# Patient Record
Sex: Female | Born: 1995 | Race: Black or African American | Hispanic: No | Marital: Single | Smoking: Never smoker
Health system: Southern US, Community
[De-identification: ages and names within clinical notes are randomized; demographics above are authoritative.]

## PROBLEM LIST (undated history)

## (undated) ENCOUNTER — Inpatient Hospital Stay (HOSPITAL_COMMUNITY): Payer: Self-pay

## (undated) DIAGNOSIS — R519 Headache, unspecified: Secondary | ICD-10-CM

## (undated) DIAGNOSIS — J45909 Unspecified asthma, uncomplicated: Secondary | ICD-10-CM

## (undated) DIAGNOSIS — R51 Headache: Secondary | ICD-10-CM

## (undated) DIAGNOSIS — O039 Complete or unspecified spontaneous abortion without complication: Secondary | ICD-10-CM

## (undated) DIAGNOSIS — D649 Anemia, unspecified: Secondary | ICD-10-CM

## (undated) HISTORY — PX: DILATION AND CURETTAGE OF UTERUS: SHX78

## (undated) HISTORY — PX: OTHER SURGICAL HISTORY: SHX169

---
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
## 2015-03-06 ENCOUNTER — Emergency Department (HOSPITAL_COMMUNITY)
Admission: EM | Admit: 2015-03-06 | Discharge: 2015-03-06 | Disposition: A | Discharge status: Home / Self Care | Payer: Medicaid Other | Attending: Emergency Medicine | Admitting: Emergency Medicine

## 2015-03-06 ENCOUNTER — Encounter (HOSPITAL_COMMUNITY): Payer: Self-pay | Admitting: Vascular Surgery

## 2015-03-06 DIAGNOSIS — J45909 Unspecified asthma, uncomplicated: Secondary | ICD-10-CM | POA: Diagnosis not present

## 2015-03-06 DIAGNOSIS — Z3202 Encounter for pregnancy test, result negative: Secondary | ICD-10-CM | POA: Insufficient documentation

## 2015-03-06 DIAGNOSIS — N760 Acute vaginitis: Secondary | ICD-10-CM | POA: Diagnosis not present

## 2015-03-06 DIAGNOSIS — K2970 Gastritis, unspecified, without bleeding: Secondary | ICD-10-CM

## 2015-03-06 DIAGNOSIS — R1013 Epigastric pain: Secondary | ICD-10-CM

## 2015-03-06 DIAGNOSIS — B9689 Other specified bacterial agents as the cause of diseases classified elsewhere: Secondary | ICD-10-CM

## 2015-03-06 HISTORY — DX: Complete or unspecified spontaneous abortion without complication: O03.9

## 2015-03-06 HISTORY — DX: Unspecified asthma, uncomplicated: J45.909

## 2015-03-06 LAB — COMPREHENSIVE METABOLIC PANEL
ALBUMIN: 3.9 g/dL (ref 3.5–5.0)
ALT: 10 U/L — ABNORMAL LOW (ref 14–54)
ANION GAP: 6 (ref 5–15)
AST: 16 U/L (ref 15–41)
Alkaline Phosphatase: 56 U/L (ref 38–126)
BUN: 6 mg/dL (ref 6–20)
CO2: 25 mmol/L (ref 22–32)
Calcium: 9.5 mg/dL (ref 8.9–10.3)
Chloride: 109 mmol/L (ref 101–111)
Creatinine, Ser: 0.92 mg/dL (ref 0.44–1.00)
GFR calc Af Amer: >60 mL/min (ref >60)
GFR calc non Af Amer: >60 mL/min (ref >60)
GLUCOSE: 97 mg/dL (ref 65–99)
POTASSIUM: 3.6 mmol/L (ref 3.5–5.1)
SODIUM: 140 mmol/L (ref 135–145)
Total Bilirubin: 0.7 mg/dL (ref 0.3–1.2)
Total Protein: 6.9 g/dL (ref 6.5–8.1)

## 2015-03-06 LAB — CBC
HEMATOCRIT: 38 % (ref 36.0–46.0)
HEMOGLOBIN: 12.9 g/dL (ref 12.0–15.0)
MCH: 30.1 pg (ref 26.0–34.0)
MCHC: 33.9 g/dL (ref 30.0–36.0)
MCV: 88.8 fL (ref 78.0–100.0)
Platelets: 232 10*3/uL (ref 150–400)
RBC: 4.28 MIL/uL (ref 3.87–5.11)
RDW: 12.9 % (ref 11.5–15.5)
WBC: 5.6 10*3/uL (ref 4.0–10.5)

## 2015-03-06 LAB — URINALYSIS, ROUTINE W REFLEX MICROSCOPIC
Bilirubin Urine: NEGATIVE
GLUCOSE, UA: NEGATIVE mg/dL
HGB URINE DIPSTICK: NEGATIVE
Ketones, ur: NEGATIVE mg/dL
Leukocytes, UA: NEGATIVE
Nitrite: NEGATIVE
PH: 7 (ref 5.0–8.0)
Protein, ur: NEGATIVE mg/dL
SPECIFIC GRAVITY, URINE: 1.023 (ref 1.005–1.030)
Urobilinogen, UA: 0.2 mg/dL (ref 0.0–1.0)

## 2015-03-06 LAB — POC URINE PREG, ED: PREG TEST UR: NEGATIVE

## 2015-03-06 LAB — WET PREP, GENITAL
TRICH WET PREP: NONE SEEN
WBC WET PREP: NONE SEEN
YEAST WET PREP: NONE SEEN

## 2015-03-06 LAB — LIPASE, BLOOD: Lipase: 21 U/L — ABNORMAL LOW (ref 22–51)

## 2015-03-06 MED ORDER — GI COCKTAIL ~~LOC~~
30.0000 mL | Freq: Once | ORAL | Status: AC
  Administered 2015-03-06: 30 mL via ORAL
  Filled 2015-03-06: qty 30

## 2015-03-06 MED ORDER — METRONIDAZOLE 500 MG PO TABS: 500.0000 mg | ORAL_TABLET | Freq: Two times a day (BID) | ORAL | Status: DC

## 2015-03-06 NOTE — Discharge Instructions (Signed)
Bacterial Vaginosis Bacterial vaginosis is a vaginal infection that occurs when the normal balance of bacteria in the vagina is disrupted. It results from an overgrowth of certain bacteria. This is the most common vaginal infection in women of childbearing age. Treatment is important to prevent complications, especially in pregnant women, as it can cause a premature delivery. CAUSES  Bacterial vaginosis is caused by an increase in harmful bacteria that are normally present in smaller amounts in the vagina. Several different kinds of bacteria can cause bacterial vaginosis. However, the reason that the condition develops is not fully understood. RISK FACTORS Certain activities or behaviors can put you at an increased risk of developing bacterial vaginosis, including:  Having a new sex partner or multiple sex partners.  Douching.  Using an intrauterine device (IUD) for contraception. Women do not get bacterial vaginosis from toilet seats, bedding, swimming pools, or contact with objects around them. SIGNS AND SYMPTOMS  Some women with bacterial vaginosis have no signs or symptoms. Common symptoms include:  Grey vaginal discharge.  A fishlike odor with discharge, especially after sexual intercourse.  Itching or burning of the vagina and vulva.  Burning or pain with urination. DIAGNOSIS  Your health care provider will take a medical history and examine the vagina for signs of bacterial vaginosis. A sample of vaginal fluid may be taken. Your health care provider will look at this sample under a microscope to check for bacteria and abnormal cells. A vaginal pH test may also be done.  TREATMENT  Bacterial vaginosis may be treated with antibiotic medicines. These may be given in the form of a pill or a vaginal cream. A second round of antibiotics may be prescribed if the condition comes back after treatment.  HOME CARE INSTRUCTIONS   Only take over-the-counter or prescription medicines as  directed by your health care provider.  If antibiotic medicine was prescribed, take it as directed. Make sure you finish it even if you start to feel better.  Do not have sex until treatment is completed.  Tell all sexual partners that you have a vaginal infection. They should see their health care provider and be treated if they have problems, such as a mild rash or itching.  Practice safe sex by using condoms and only having one sex partner. SEEK MEDICAL CARE IF:   Your symptoms are not improving after 3 days of treatment.  You have increased discharge or pain.  You have a fever. MAKE SURE YOU:   Understand these instructions.  Will watch your condition.  Will get help right away if you are not doing well or get worse. FOR MORE INFORMATION  Centers for Disease Control and Prevention, Division of STD Prevention: SolutionApps.co.za American Sexual Health Association (ASHA): www.ashastd.org  Document Released: 07/01/2005 Document Revised: 04/21/2013 Document Reviewed: 02/10/2013 [facility] Patient Information 2015 [place], [place]. This information is not intended to replace advice given to you by your health care provider. Make sure you discuss any questions you have with your health care provider.  Gastritis, Adult Gastritis is soreness and puffiness (inflammation) of the lining of the stomach. If you do not get help, gastritis can cause bleeding and sores (ulcers) in the stomach. HOME CARE   Only take medicine as told by your doctor.  If you were given antibiotic medicines, take them as told. Finish the medicines even if you start to feel better.  Drink enough fluids to keep your pee (urine) clear or pale yellow.  Avoid foods and drinks that make your  problems worse. Foods you may want to avoid include:  Caffeine or alcohol.  Chocolate.  Mint.  Garlic and onions.  Spicy foods.  Citrus fruits, including oranges, lemons, or limes.  Food containing tomatoes, including  sauce, chili, salsa, and pizza.  Fried and fatty foods.  Eat small meals throughout the day instead of large meals. GET HELP RIGHT AWAY IF:   You have black or dark red poop (stools).  You throw up (vomit) blood. It may look like coffee grounds.  You cannot keep fluids down.  Your belly (abdominal) pain gets worse.  You have a fever.  You do not feel better after 1 week.  You have any other questions or concerns. MAKE SURE YOU:   Understand these instructions.  Will watch your condition.  Will get help right away if you are not doing well or get worse. Document Released: 12/18/2007 Document Revised: 09/23/2011 Document Reviewed: 08/14/2011 [facility] Patient Information 2015 [place], [place]. This information is not intended to replace advice given to you by your health care provider. Make sure you discuss any questions you have with your health care provider.

## 2015-03-06 NOTE — ED Provider Notes (Signed)
Patient presented to the ER with abdominal pain. Patient reports that symptoms began several days ago. She reports the pain comes and goes, has not identified any causative factors. She has had mild nausea but no vomiting or diarrhea. No rectal bleeding or melanotic. Patient denies any pain in the lower abdomen or pelvis, no symptoms below the umbilicus other than the fact that she is experiencing vaginal bleeding currently which is early for her normal menstrual period.  Face to face Exam: HEENT - PERRLA Lungs - CTAB Heart - RRR, no M/R/G Abd - S/ND, slight epigastric tenderness, no right upper quadrant tenderness. No guarding or rebound Neuro - alert, oriented x3  Plan: Lab work unremarkable. Epigastric discomfort difficult to connect to menstrual bleeding. Likely 2 separate entities, will need to follow-up with OB/GYN for vaginal bleeding, treat for possible gastritis symptomatically.  Gilda Crease, MD 03/06/15 [phone]

## 2015-03-06 NOTE — ED Provider Notes (Signed)
History   Chief Complaint  Patient presents with  . Abdominal Pain  . Vaginal Bleeding    HPI Previous healthy 19 year old female with no pertinent past medical history presents ED for evaluation of abdominal pain and vaginal bleeding. Patient reports yesterday afternoon she had gradual onset epigastric pain with mild nausea but denies any vomiting, diarrhea. She also felt chills but denies any fevers during this time. She says applying pressure to her epigastric region improves her pain. They does not alter her symptoms. Pain does not radiate. Currently rated as mild to moderate. Progression is been unchanged. This is a new problem which she has not had before. She has not tried any treatments at home. Patient reports having some mild shortness of breath when the pain gets worse but denies any shortness of breath, chest pain otherwise. No recent illness and denies any cough, URI symptoms, leg pain, leg swelling. She does report having some white discharge which is new. Denies history of STDs. Does have one new sexual partner who she has had unprotected sex with. Patient is not taking NSAIDs. Not on any control pills. No history of prior abdominal surgeries. No other complaints this time.  Past medical/surgical history, social history, medications, allergies and FH have been reviewed with patient and/or in documentation.  Past Medical History  Diagnosis Date  . Asthma   . Miscarriage    Past Surgical History  Procedure Laterality Date  . Miscarriage    . Dilation and curettage of uterus     No family history on file. Social History  Substance Use Topics  . Smoking status: Never Smoker   . Smokeless tobacco: Never Used  . Alcohol Use: No     Review of Systems Constitutional: Negative for fever, chills and fatigue.  HENT: Negative for congestion, rhinorrhea and sore throat.   Eyes: Negative for visual disturbance.  Respiratory: Negative for cough, shortness of breath and wheezing.    Cardiovascular: Negative for chest pain.  Gastrointestinal: Pos abd pain, nausea, neg vomiting, and diarrhea.  Genitourinary: Negative for flank pain, dysuria, frequency. + VB, vag d/c Musculoskeletal: Negative for back pain, neck pain and neck stiffness, leg pain/swelling.  Skin: Negative for rash.  Neurological: Negative for dizziness and headaches.  All other systems reviewed and are negative.   Physical Exam  Physical Exam  Genitourinary: There is no rash, tenderness, lesion or injury on the right labia. There is no tenderness, lesion or injury on the left labia. Uterus is not enlarged and not tender. Cervix exhibits no motion tenderness, no discharge and no friability. Right adnexum displays no mass, no tenderness and no fullness. Left adnexum displays no mass, no tenderness and no fullness. There is bleeding in the vagina. No erythema or tenderness in the vagina. No foreign body around the vagina. No signs of injury around the vagina. No vaginal discharge found.   ED Triage Vitals  Enc Vitals Group     BP 03/06/15 1127 126/76 mmHg     Pulse Rate 03/06/15 1127 74     Resp 03/06/15 1127 16     Temp 03/06/15 1127 98 F (36.7 C)     Temp Source 03/06/15 1127 Oral     SpO2 03/06/15 1127 100 %     Weight --      Height --      Head Cir --      Peak Flow --      Pain Score 03/06/15 1127 8     Pain Loc --  Pain Edu? --      Excl. in GC? --    Constitutional: Patient is well-appearing, well-nourished and in no acute distress. Head: Normocephalic and atraumatic.  Eyes: Extraocular motion intact, no scleral icterus Neck: Supple without meningismus, mass, or overt JVD Respiratory: Effort normal and breath sounds normal. No respiratory distress. CV: Heart regular rate and rhythm, no obvious murmurs.  Pulses +2 and symmetric Abdomen: Soft, non-distended, with noTTP in all region(s). No rebound/guarding. No TTP over McBurney's, Neg Murphy/Rovsing signs. No mass.  MSK: Extremities  are atraumatic without deformity, ROM intact Skin: Warm, dry, intact Neuro: Alert and oriented, no motor deficit noted Psychiatric: Mood and affect are normal.   ED Course  Procedures   Labs Reviewed  LIPASE, BLOOD - Abnormal; Notable for the following:    Lipase 21 (*)    All other components within normal limits  COMPREHENSIVE METABOLIC PANEL - Abnormal; Notable for the following:    ALT 10 (*)    All other components within normal limits  URINALYSIS, ROUTINE W REFLEX MICROSCOPIC (NOT AT [facility]) - Abnormal; Notable for the following:    APPearance HAZY (*)    All other components within normal limits  CBC  POC URINE PREG, ED  WET PREP  (BD AFFIRM) ([place])  GC/CHLAMYDIA PROBE AMP ([place]) NOT AT [facility]   I personally reviewed and interpreted all labs.  No orders to display   I personally viewed above image(s) which were used in my medical decision making. Formal interpretations by Radiology.  MDM: Traci Spears is a 19 y.o. female p/w abd pain. H&P as above.  Clinical picture consistent with likely GI source. Does not suggest acute obstruction, perforation, ischemia, acute pancreatitis, cholecystitis, appendicitis, AAA, UTI, pyelonephritis, nephrolithiasis, diverticulosis/itis, hernia, PID, Intrauterine Pregnancy, Ectopic Pregnancy, Ovarian Torsion, Tubal Ovarian Abscess, STD, or other acute abdominal emergency which might require inpatient or emergent evaluation or treatment as this would be inconsistent with history and physical.   Additionally, patient is perc negative and I have very low suspicion for PE as patient's clinical picture does not correlate with this. Pelvic exam was performed and is benign.  Symptoms reported to be improving. Labs reviewed, has lab findings consistent with bacterial vaginosis and otherwise found to be WNL. Feel patient's symptoms are likely secondary to gastritis and advised Maalox. Discussed with patient. Will continue symptomatic  management. Advised close PCP follow-up if not improved in 2 days.  Clinical Impression: 1. Epigastric pain   2. Gastritis   3. Bacterial vaginosis     Disposition: Discharge  Condition: Good  I have discussed the results, Dx and Tx plan with the pt(& family if present). He/she/they expressed understanding and agree(s) with the plan. Discharge instructions discussed at great length. Strict return precautions discussed and pt &/or family have verbalized understanding of the instructions. No further questions at time of discharge.    Discharge Medication List as of 03/06/2015  5:52 PM    START taking these medications   Details  metroNIDAZOLE (FLAGYL) 500 MG tablet Take 1 tablet (500 mg total) by mouth 2 (two) times daily., Starting 03/06/2015, Until Discontinued, Print        Follow Up: [facility]     [address] [place] [address] [phone]  As needed, To establish primary care, call above  [facility] EMERGENCY DEPARTMENT [address] [id number] mc [place] [address] [phone]  If symptoms worsen   Pt seen in  conjunction with Dr. Orpah Greek, DO [facility] Emergency Medicine Resident - PGY-3     Ames Dura, MD 03/07/15 [id number]  Gilda Crease, MD 03/07/15 [phone]

## 2015-03-06 NOTE — ED Notes (Signed)
Pt reports to the ED for eval of mid abd pain and vaginal bleeding. Pt reports that this am she passed a small blood clot. She reports after passing the clot she stopped bleeding. Pt reports recent unprotected sex several days ago. Pt reports nausea but denies any V/D or urinary symptoms. Reports some chills but unsure if she is running a fever. Pt A&Ox4, resp e/u, and skin warm and dry.

## 2015-03-07 LAB — GC/CHLAMYDIA PROBE AMP (~~LOC~~) NOT AT ARMC
Chlamydia: NEGATIVE
Neisseria Gonorrhea: NEGATIVE

## 2015-05-18 ENCOUNTER — Emergency Department (HOSPITAL_COMMUNITY): Payer: Medicaid Other

## 2015-05-18 ENCOUNTER — Encounter (HOSPITAL_COMMUNITY): Payer: Self-pay

## 2015-05-18 ENCOUNTER — Emergency Department (HOSPITAL_COMMUNITY)
Admission: EM | Admit: 2015-05-18 | Discharge: 2015-05-18 | Disposition: A | Discharge status: Home / Self Care | Payer: Self-pay | Attending: Emergency Medicine | Admitting: Emergency Medicine

## 2015-05-18 DIAGNOSIS — J45909 Unspecified asthma, uncomplicated: Secondary | ICD-10-CM | POA: Insufficient documentation

## 2015-05-18 DIAGNOSIS — R1030 Lower abdominal pain, unspecified: Secondary | ICD-10-CM | POA: Insufficient documentation

## 2015-05-18 DIAGNOSIS — N8300 Follicular cyst of ovary, unspecified side: Secondary | ICD-10-CM | POA: Insufficient documentation

## 2015-05-18 DIAGNOSIS — N83209 Unspecified ovarian cyst, unspecified side: Secondary | ICD-10-CM

## 2015-05-18 DIAGNOSIS — R102 Pelvic and perineal pain: Secondary | ICD-10-CM | POA: Insufficient documentation

## 2015-05-18 DIAGNOSIS — R1032 Left lower quadrant pain: Secondary | ICD-10-CM | POA: Insufficient documentation

## 2015-05-18 DIAGNOSIS — R1033 Periumbilical pain: Secondary | ICD-10-CM | POA: Insufficient documentation

## 2015-05-18 DIAGNOSIS — R52 Pain, unspecified: Secondary | ICD-10-CM

## 2015-05-18 DIAGNOSIS — R1031 Right lower quadrant pain: Secondary | ICD-10-CM | POA: Insufficient documentation

## 2015-05-18 LAB — COMPREHENSIVE METABOLIC PANEL
ALBUMIN: 3.9 g/dL (ref 3.5–5.0)
ALT: 8 U/L — AB (ref 14–54)
ANION GAP: 8 (ref 5–15)
AST: 15 U/L (ref 15–41)
Alkaline Phosphatase: 48 U/L (ref 38–126)
BUN: 16 mg/dL (ref 6–20)
CHLORIDE: 107 mmol/L (ref 101–111)
CO2: 22 mmol/L (ref 22–32)
CREATININE: 0.93 mg/dL (ref 0.44–1.00)
Calcium: 9.2 mg/dL (ref 8.9–10.3)
GFR calc non Af Amer: >60 mL/min (ref >60)
GLUCOSE: 84 mg/dL (ref 65–99)
Potassium: 4 mmol/L (ref 3.5–5.1)
SODIUM: 137 mmol/L (ref 135–145)
Total Bilirubin: 0.4 mg/dL (ref 0.3–1.2)
Total Protein: 6.4 g/dL — ABNORMAL LOW (ref 6.5–8.1)

## 2015-05-18 LAB — CBC WITH DIFFERENTIAL/PLATELET
BASOS PCT: 0 %
Basophils Absolute: 0 10*3/uL (ref 0.0–0.1)
EOS ABS: 0.2 10*3/uL (ref 0.0–0.7)
EOS PCT: 2 %
HCT: 35.5 % — ABNORMAL LOW (ref 36.0–46.0)
HEMOGLOBIN: 12.3 g/dL (ref 12.0–15.0)
LYMPHS ABS: 2.2 10*3/uL (ref 0.7–4.0)
Lymphocytes Relative: 27 %
MCH: 30.4 pg (ref 26.0–34.0)
MCHC: 34.6 g/dL (ref 30.0–36.0)
MCV: 87.7 fL (ref 78.0–100.0)
Monocytes Absolute: 0.7 10*3/uL (ref 0.1–1.0)
Monocytes Relative: 9 %
NEUTROS PCT: 62 %
Neutro Abs: 5.1 10*3/uL (ref 1.7–7.7)
PLATELETS: 245 10*3/uL (ref 150–400)
RBC: 4.05 MIL/uL (ref 3.87–5.11)
RDW: 12.8 % (ref 11.5–15.5)
WBC: 8.2 10*3/uL (ref 4.0–10.5)

## 2015-05-18 LAB — WET PREP, GENITAL
TRICH WET PREP: NONE SEEN
YEAST WET PREP: NONE SEEN

## 2015-05-18 LAB — URINALYSIS, ROUTINE W REFLEX MICROSCOPIC
Bilirubin Urine: NEGATIVE
Glucose, UA: NEGATIVE mg/dL
Hgb urine dipstick: NEGATIVE
Ketones, ur: NEGATIVE mg/dL
LEUKOCYTES UA: NEGATIVE
NITRITE: NEGATIVE
PH: 7.5 (ref 5.0–8.0)
Protein, ur: NEGATIVE mg/dL
SPECIFIC GRAVITY, URINE: 1.016 (ref 1.005–1.030)
UROBILINOGEN UA: 1 mg/dL (ref 0.0–1.0)

## 2015-05-18 LAB — HIV ANTIBODY (ROUTINE TESTING W REFLEX): HIV Screen 4th Generation wRfx: NONREACTIVE

## 2015-05-18 LAB — POC URINE PREG, ED: Preg Test, Ur: NEGATIVE

## 2015-05-18 LAB — LIPASE, BLOOD: Lipase: 37 U/L (ref 11–51)

## 2015-05-18 MED ORDER — NAPROXEN 500 MG PO TABS: 500.0000 mg | ORAL_TABLET | Freq: Two times a day (BID) | ORAL | Status: DC

## 2015-05-18 MED ORDER — SODIUM CHLORIDE 0.9 % IV BOLUS (SEPSIS)
1000.0000 mL | Freq: Once | INTRAVENOUS | Status: AC
  Administered 2015-05-18: 1000 mL via INTRAVENOUS

## 2015-05-18 MED ORDER — IBUPROFEN 800 MG PO TABS
800.0000 mg | ORAL_TABLET | Freq: Once | ORAL | Status: AC
  Administered 2015-05-18: 800 mg via ORAL
  Filled 2015-05-18: qty 1

## 2015-05-18 NOTE — Discharge Instructions (Signed)
Ovarian Cyst  An ovarian cyst is a sac filled with fluid or blood. This sac is attached to the ovary. Some cysts go away on their own. Other cysts need treatment.   HOME CARE   · Only take medicine as told by your doctor.  · Follow up with your doctor as told.  · Get regular pelvic exams and Pap tests.  GET HELP IF:  · Your periods are late, not regular, or painful.  · You stop having periods.  · Your belly (abdominal) or pelvic pain does not go away.  · Your belly becomes large or puffy (swollen).  · You have a hard time peeing (totally emptying your bladder).  · You have pressure on your bladder.  · You have pain during sex.  · You feel fullness, pressure, or discomfort in your belly.  · You lose weight for no reason.  · You feel sick most of the time.  · You have a hard time pooping (constipation).  · You do not feel like eating.  · You develop pimples (acne).  · You have an increase in hair on your body and face.  · You are gaining weight for no reason.  · You think you are pregnant.  GET HELP RIGHT AWAY IF:   · Your belly pain gets worse.  · You feel sick to your stomach (nauseous), and you throw up (vomit).  · You have a fever that comes on fast.  · You have belly pain while pooping (bowel movement).  · Your periods are heavier than usual.  MAKE SURE YOU:   · Understand these instructions.  · Will watch your condition.  · Will get help right away if you are not doing well or get worse.     This information is not intended to replace advice given to you by your health care provider. Make sure you discuss any questions you have with your health care provider.     Document Released: 12/18/2007 Document Revised: 04/21/2013 Document Reviewed: 03/08/2013  Elsevier Interactive Patient Education ©2016 [employer]

## 2015-05-18 NOTE — ED Provider Notes (Signed)
CSN: [id number]     Arrival date & time 05/18/15  [id number] History   First MD Initiated Contact with Patient 05/18/15 0631     Chief Complaint  Patient presents with  . Abdominal Pain  . Vaginal Discharge     (Consider location/radiation/quality/duration/timing/severity/associated sxs/prior Treatment) HPI   Gilman Buttneriquea Plasencia is a 19 y.o. female with PMH significant for asthma and miscarriage who presents with lower abdominal pain that began around 2 AM this morning.  She describes it as sharp, non-radiating pulsing/throbbing, and constant.  Relieved with naprosyn.  Made worse sitting.  Endorses vaginal discharge (yellow) and pelvic pain.  Denies SOB, CP, N/V, urinary symptoms, or vaginal bleeding.  LMP May 04, 2015.  She is sexually active.   Past Medical History  Diagnosis Date  . Asthma   . Miscarriage    Past Surgical History  Procedure Laterality Date  . Miscarriage    . Dilation and curettage of uterus     History reviewed. No pertinent family history. Social History  Substance Use Topics  . Smoking status: Never Smoker   . Smokeless tobacco: Never Used  . Alcohol Use: No   OB History    No data available     Review of Systems All other systems negative unless otherwise stated in HPI   Allergies  Zofran  Home Medications   Prior to Admission medications   Medication Sig Start Date End Date Taking? Authorizing Provider  metroNIDAZOLE (FLAGYL) 500 MG tablet Take 1 tablet (500 mg total) by mouth 2 (two) times daily. Patient not taking: Reported on 05/18/2015 03/06/15   Ames DuraStephen Balleh, MD  naproxen (NAPROSYN) 500 MG tablet Take 1 tablet (500 mg total) by mouth 2 (two) times daily. 05/18/15   Gabe Glace, PA-C   BP 125/75 mmHg  Pulse 70  Temp(Src) 98.1 F (36.7 C) (Oral)  Resp 16  Ht 5' 1.5" (1.562 m)  Wt 155 lb (70.308 kg)  BMI 28.82 kg/m2  SpO2 100% Physical Exam  Constitutional: She is oriented to person, place, and time. She appears well-developed and  well-nourished.  HENT:  Head: Normocephalic and atraumatic.  Mouth/Throat: Oropharynx is clear and moist.  Eyes: Conjunctivae are normal. Pupils are equal, round, and reactive to light.  Neck: Normal range of motion. Neck supple.  Cardiovascular: Normal rate, regular rhythm and normal heart sounds.   No murmur heard. Pulmonary/Chest: Effort normal and breath sounds normal. No accessory muscle usage or stridor. No respiratory distress. She has no wheezes. She has no rhonchi. She has no rales.  Abdominal: Soft. Bowel sounds are normal. She exhibits no distension. There is tenderness in the right lower quadrant, periumbilical area, suprapubic area and left lower quadrant. There is no rigidity, no rebound and no guarding.  Genitourinary: Uterus normal. There is no tenderness or lesion on the right labia. There is no tenderness or lesion on the left labia. Cervix exhibits discharge (mild). Cervix exhibits no motion tenderness. Right adnexum displays no tenderness. Left adnexum displays no tenderness. No tenderness or bleeding in the vagina. No vaginal discharge found.  Musculoskeletal: Normal range of motion.  Lymphadenopathy:    She has no cervical adenopathy.  Neurological: She is alert and oriented to person, place, and time.  Speech clear without dysarthria.  Skin: Skin is warm and dry.  Psychiatric: She has a normal mood and affect. Her behavior is normal.    ED Course  Procedures (including critical care time) Labs Review Labs Reviewed  WET PREP, GENITAL - Abnormal; Notable  for the following:    Clue Cells Wet Prep HPF POC FEW (*)    WBC, Wet Prep HPF POC FEW (*)    All other components within normal limits  CBC WITH DIFFERENTIAL/PLATELET - Abnormal; Notable for the following:    HCT 35.5 (*)    All other components within normal limits  COMPREHENSIVE METABOLIC PANEL - Abnormal; Notable for the following:    Total Protein 6.4 (*)    ALT 8 (*)    All other components within normal  limits  LIPASE, BLOOD  URINALYSIS, ROUTINE W REFLEX MICROSCOPIC (NOT AT [facility])  HIV ANTIBODY (ROUTINE TESTING)  POC URINE PREG, ED  GC/CHLAMYDIA PROBE AMP ([facility]) NOT AT [facility]    Imaging Review US Transvaginal Non-ob  05/18/2015  CLINICAL DATA:  Left lower quadrant and mid pelvic pain since 2 a.m. today EXAM: TRANSABDOMINAL AND TRANSVAGINAL ULTRASOUND OF PELVIS TECHNIQUE: Both transabdominal and transvaginal ultrasound examinations of the pelvis were performed. Transabdominal technique was performed for global imaging of the pelvis including uterus, ovaries, adnexal regions, and pelvic cul-de-sac. It was necessary to proceed with endovaginal exam following the transabdominal exam to visualize the endometrium and ovaries. COMPARISON:  None in PACs FINDINGS: Uterus Measurements: 7.0 x 3.7 x 4.2 cm. No fibroids or other mass visualized. Endometrium Thickness: 6.6 mm.  No focal abnormality visualized. Right ovary Measurements: 3.5 x 2.3 x 2.6 cm. Normal appearance/no adnexal mass. Left ovary Measurements: 4.3 x 2.9 x 3.5 cm. There is a 3.2 x 2.0 x 2.3 cm focus adjacent to the left ovary which exhibits mixed decreased echogenicity. The structure is not hypervascular. There are developing follicles in the surrounding ovarian tissue. Other findings There is a small amount of free pelvic fluid. IMPRESSION: 1. Complex hypoechoic structure in the left ovary likely reflects a hemorrhagic cyst measuring up to 3.2 cm. There is a small amount of free pelvic fluid. Correlation with patient's beta HCG and white blood cell count is recommended. If the patient's symptoms resolve, no further imaging is indicated. If patient's symptoms persist, follow-up ultrasound in 6-12 weeks is recommended. 2. The right ovary and the uterus are unremarkable. Electronically Signed   By: David  [place] M.D.   On: 05/18/2015 09:54   US Pelvis Complete  05/18/2015  CLINICAL DATA:  Left lower quadrant and mid pelvic pain since 2 a.m.  today EXAM: TRANSABDOMINAL AND TRANSVAGINAL ULTRASOUND OF PELVIS TECHNIQUE: Both transabdominal and transvaginal ultrasound examinations of the pelvis were performed. Transabdominal technique was performed for global imaging of the pelvis including uterus, ovaries, adnexal regions, and pelvic cul-de-sac. It was necessary to proceed with endovaginal exam following the transabdominal exam to visualize the endometrium and ovaries. COMPARISON:  None in PACs FINDINGS: Uterus Measurements: 7.0 x 3.7 x 4.2 cm. No fibroids or other mass visualized. Endometrium Thickness: 6.6 mm.  No focal abnormality visualized. Right ovary Measurements: 3.5 x 2.3 x 2.6 cm. Normal appearance/no adnexal mass. Left ovary Measurements: 4.3 x 2.9 x 3.5 cm. There is a 3.2 x 2.0 x 2.3 cm focus adjacent to the left ovary which exhibits mixed decreased echogenicity. The structure is not hypervascular. There are developing follicles in the surrounding ovarian tissue. Other findings There is a small amount of free pelvic fluid. IMPRESSION: 1. Complex hypoechoic structure in the left ovary likely reflects a hemorrhagic cyst measuring up to 3.2 cm. There is a small amount of free pelvic fluid. Correlation with patient's beta HCG and white blood cell count is recommended. If the patient's symptoms  resolve, no further imaging is indicated. If patient's symptoms persist, follow-up ultrasound in 6-12 weeks is recommended. 2. The right ovary and the uterus are unremarkable. Electronically Signed   By: David  [place] M.D.   On: 05/18/2015 09:54   I have personally reviewed and evaluated these images and lab results as part of my medical decision-making.   EKG Interpretation None      MDM   Final diagnoses:  Hemorrhagic cyst of ovary    Patient presents with lower abdominal pain and abnormal vaginal discharge.  VSS, NAD.  On exam, abdomen is tender in RLQ/LLQ/suprapubic/periumbilical areas.  No rebound or guarding.  Heart RRR, lungs CTAB.  GU  exam, no CMT or adnexal tenderness.  Mild cervical discharge.  Will give fluids.  Will obtain wet prep, CBC, CMP. Lipase, urine pregnancy, GC/chlamydia, and UA.  Will give motrin for pain.  CBC, CMP, lipase, and wet prep unremarkable.  Doubt PID.  Doubt torsion given history.  Suspect ovarian cyst.  Will obtain pelvic ultrasound. UA shows no evidence of infection.  Doubt UTI.  Urine pregnancy negative. Pelvic ultrasound shows 3.2 cm hypoechoic structure in left ovary which is likely a hemorrhagic cyst. If patient's symptoms persist, follow up ultrasound 6-12 weeks.   Upon reassessment, patient's pain has improved.  Suspect left hemorrhagic cyst.  Follow up [facility] in 3 days.  Evaluation does not show pathology requring ongoing emergent intervention or admission. Pt is hemodynamically stable and mentating appropriately. Discussed findings/results and plan with patient/guardian, who agrees with plan. All questions answered. Return precautions discussed and outpatient follow up given.      Cheri Fowler, PA-C 05/18/15 1105  Alvira Monday, MD 05/21/15 1350

## 2015-05-18 NOTE — ED Notes (Signed)
Pt c/o lower abd/pelvic pain beginning this morning. Pt also c/o yellowish vaginal discharge.

## 2015-05-19 LAB — GC/CHLAMYDIA PROBE AMP (~~LOC~~) NOT AT ARMC
Chlamydia: NEGATIVE
Neisseria Gonorrhea: NEGATIVE

## 2015-07-16 NOTE — L&D Delivery Note (Signed)
Patient is 20 y.o. G2P0010 [id number] admitted for SOL   Delivery Note At 4:23 AM a viable female was delivered via  (Presentation: ROA).  APGAR: 9, 9; weight  pending.   Placenta status: intact.  Cord: 3 vessel  Without complications.   Anesthesia:  epidural Episiotomy:  none Lacerations:  1st degree Suture Repair: 2.0 vicryl Est. Blood Loss (mL):  100  Mom to postpartum.  Baby to Couplet care / Skin to Skin.  Leland HerElsia J Yoo 03/18/2016, 4:36 AM      Upon arrival patient was complete and pushing. She pushed with good maternal effort to deliver a healthy baby boy. Baby delivered without difficulty, was noted to have good tone and place on maternal abdomen for oral suctioning, drying and stimulation. Delayed cord clamping performed. Placenta delivered intact with 3V cord. Vaginal canal and perineum was inspected and repaired; hemostatic. Pitocin was started and uterus massaged until bleeding slowed. Counts of sharps, instruments, and lap pads were all correct.   Leland HerElsia J Yoo, DO PGY-1 9/4/20174:36 AM

## 2015-07-21 ENCOUNTER — Encounter (HOSPITAL_COMMUNITY): Payer: Self-pay | Admitting: Emergency Medicine

## 2015-07-21 ENCOUNTER — Emergency Department (HOSPITAL_COMMUNITY)
Admission: EM | Admit: 2015-07-21 | Discharge: 2015-07-21 | Disposition: A | Discharge status: Home / Self Care | Payer: Medicaid Other | Attending: Emergency Medicine | Admitting: Emergency Medicine

## 2015-07-21 DIAGNOSIS — L0231 Cutaneous abscess of buttock: Secondary | ICD-10-CM | POA: Insufficient documentation

## 2015-07-21 DIAGNOSIS — Z791 Long term (current) use of non-steroidal anti-inflammatories (NSAID): Secondary | ICD-10-CM | POA: Insufficient documentation

## 2015-07-21 DIAGNOSIS — J45909 Unspecified asthma, uncomplicated: Secondary | ICD-10-CM | POA: Insufficient documentation

## 2015-07-21 MED ORDER — CEPHALEXIN 500 MG PO CAPS
500.0000 mg | ORAL_CAPSULE | Freq: Two times a day (BID) | ORAL | Status: DC
Start: 2015-07-21 — End: 2015-09-03

## 2015-07-21 MED ORDER — CEPHALEXIN 250 MG PO CAPS
500.0000 mg | ORAL_CAPSULE | Freq: Once | ORAL | Status: AC
  Administered 2015-07-21: 500 mg via ORAL
  Filled 2015-07-21: qty 2

## 2015-07-21 NOTE — ED Notes (Signed)
Pt. reports abscess at left buttock with drainage onset this week , denies fever .

## 2015-07-21 NOTE — ED Provider Notes (Signed)
CSN: [id number]     Arrival date & time 07/21/15  0258 History   By signing my name below, I, Traci Spears, attest that this documentation has been prepared under the direction and in the presence of Traci Munchobert Traci Bansal, MD.  Electronically Signed: Arlan OrganAshley Spears, ED Scribe. 07/21/2015. 3:45 AM.   Chief Complaint  Patient presents with  . Abscess   The history is provided by the patient. No language interpreter was used.    HPI Comments: Traci Spears is a 20 y.o. female who presents to the Emergency Department here for an ongoing, recurrent, painful abscess to the L buttock x 2-3 days. Area is tender to the touch and currently draining. No alleviating factors at this time. No OTC medications or home remedies attempted prior to arrival. No recent fever, chills, nausea, vomiting, diarrhea, abdominal pain, or shortness of breath. [name] admits to a history of abscesses and states current episode is her second severe occurrence and 4th abscess in the last year. She denies any previous follow up or establishment with general surgery for this concern. Pt with known allergies to Zofran.  PCP: No primary care provider on file.    Past Medical History  Diagnosis Date  . Asthma   . Miscarriage    Past Surgical History  Procedure Laterality Date  . Miscarriage    . Dilation and curettage of uterus     No family history on file. Social History  Substance Use Topics  . Smoking status: Never Smoker   . Smokeless tobacco: Never Used  . Alcohol Use: No   OB History    No data available     Review of Systems  Constitutional: Negative for fever and chills.  Respiratory: Negative for cough.   Gastrointestinal: Negative for nausea, vomiting and abdominal pain.  Genitourinary: Negative for dysuria.  Skin: Positive for wound.  Allergic/Immunologic: Negative for immunocompromised state.  Neurological: Negative for headaches.  Psychiatric/Behavioral: Negative for confusion.      Allergies   Zofran  Home Medications   Prior to Admission medications   Medication Sig Start Date End Date Taking? Authorizing Provider  metroNIDAZOLE (FLAGYL) 500 MG tablet Take 1 tablet (500 mg total) by mouth 2 (two) times daily. Patient not taking: Reported on 05/18/2015 03/06/15   Ames DuraStephen Balleh, MD  naproxen (NAPROSYN) 500 MG tablet Take 1 tablet (500 mg total) by mouth 2 (two) times daily. 05/18/15   Cheri FowlerKayla Rose, PA-C   Triage Vitals: BP 118/73 mmHg  Pulse 93  Temp(Src) 98.6 F (37 C) (Oral)  Resp 18  SpO2 100%  LMP 06/25/2015   Physical Exam  Constitutional: She is oriented to person, place, and time. She appears well-developed and well-nourished.  HENT:  Head: Normocephalic.  Eyes: EOM are normal.  Neck: Normal range of motion.  Pulmonary/Chest: Effort normal.  Abdominal: She exhibits no distension.  Genitourinary:  Actively draining abscess to the L buttock exquisitely tender to palpation, with mild pressure, additional blood/pus is expelled from the open area of the abscess, approximately 1 cm in diameter, on the medial inferior left gluteal cleft  Musculoskeletal: Normal range of motion.  Neurological: She is alert and oriented to person, place, and time.  Psychiatric: She has a normal mood and affect.  Nursing note and vitals reviewed.   ED Course  Procedures (including critical care time)  DIAGNOSTIC STUDIES: Oxygen Saturation is 100% on RA, Normal by my interpretation.    COORDINATION OF CARE: 3:39 AM- Will encourage outpatient follow up with general  surgery. Discussed treatment plan with pt at bedside and pt agreed to plan.       MDM   I personally performed the services described in this documentation, which was scribed in my presence. The recorded information has been reviewed and is accurate.    Well-appearing female presents with concern for recurrent left buttock abscess.   The patient does have evidence for draining abscess, but no evidence for bacteremia,  sepsis. Given the spontaneous drainage, additional incision, drainage not indicated. However, the patient did have wound care provided, was discharged in stable condition to follow-up with surgery  given the recurrence of her lesions.   Traci Munch, MD 07/21/15 [phone]

## 2015-07-21 NOTE — Discharge Instructions (Signed)
As discussed, with your recurrent abscesses, it is very important that you follow-up with our surgical colleagues.  To facilitate healing, you may use Epsom Salt bathes, 4 times daily for the next week.  Return here for any concerning changes in your condition.   Abscess An abscess (boil or furuncle) is an infected area on or under the skin. This area is filled with yellowish-white fluid (pus) and other material (debris). HOME CARE   Only take medicines as told by your doctor.  If you were given antibiotic medicine, take it as directed. Finish the medicine even if you start to feel better.  If gauze is used, follow your doctor's directions for changing the gauze.  To avoid spreading the infection:  Keep your abscess covered with a bandage.  Wash your hands well.  Do not share personal care items, towels, or whirlpools with others.  Avoid skin contact with others.  Keep your skin and clothes clean around the abscess.  Keep all doctor visits as told. GET HELP RIGHT AWAY IF:   You have more pain, puffiness (swelling), or redness in the wound site.  You have more fluid or blood coming from the wound site.  You have muscle aches, chills, or you feel sick.  You have a fever. MAKE SURE YOU:   Understand these instructions.  Will watch your condition.  Will get help right away if you are not doing well or get worse.   This information is not intended to replace advice given to you by your health care provider. Make sure you discuss any questions you have with your health care provider.   Document Released: 12/18/2007 Document Revised: 12/31/2011 Document Reviewed: 09/14/2011 Elsevier Interactive Patient Education [employer]2016 Elsevier Inc.

## 2015-09-02 ENCOUNTER — Encounter (HOSPITAL_COMMUNITY): Payer: Self-pay

## 2015-09-02 ENCOUNTER — Inpatient Hospital Stay (HOSPITAL_COMMUNITY)
Admission: AD | Admit: 2015-09-02 | Discharge: 2015-09-03 | Disposition: A | Discharge status: Home / Self Care | Payer: Medicaid Other | Source: Ambulatory Visit | Attending: Obstetrics and Gynecology | Admitting: Obstetrics and Gynecology

## 2015-09-02 DIAGNOSIS — M549 Dorsalgia, unspecified: Secondary | ICD-10-CM | POA: Insufficient documentation

## 2015-09-02 DIAGNOSIS — R109 Unspecified abdominal pain: Secondary | ICD-10-CM | POA: Diagnosis present

## 2015-09-02 DIAGNOSIS — O9989 Other specified diseases and conditions complicating pregnancy, childbirth and the puerperium: Secondary | ICD-10-CM | POA: Diagnosis not present

## 2015-09-02 DIAGNOSIS — Z3A01 Less than 8 weeks gestation of pregnancy: Secondary | ICD-10-CM | POA: Insufficient documentation

## 2015-09-02 DIAGNOSIS — O99711 Diseases of the skin and subcutaneous tissue complicating pregnancy, first trimester: Secondary | ICD-10-CM | POA: Insufficient documentation

## 2015-09-02 DIAGNOSIS — L0501 Pilonidal cyst with abscess: Secondary | ICD-10-CM | POA: Diagnosis not present

## 2015-09-02 DIAGNOSIS — R110 Nausea: Secondary | ICD-10-CM | POA: Diagnosis not present

## 2015-09-02 LAB — URINALYSIS, ROUTINE W REFLEX MICROSCOPIC
Bilirubin Urine: NEGATIVE
Glucose, UA: NEGATIVE mg/dL
Hgb urine dipstick: NEGATIVE
Ketones, ur: NEGATIVE mg/dL
LEUKOCYTES UA: NEGATIVE
NITRITE: NEGATIVE
Protein, ur: NEGATIVE mg/dL
SPECIFIC GRAVITY, URINE: 1.025 (ref 1.005–1.030)
pH: 6.5 (ref 5.0–8.0)

## 2015-09-02 LAB — POCT PREGNANCY, URINE: PREG TEST UR: POSITIVE — AB

## 2015-09-02 NOTE — MAU Note (Signed)
Boil on butt, middle top part x 3 days.  Has had in Jan 2017 and May 2016 - once had to have it drained and the other time, it burst on its own.  Lower back and low abd pain x 2 days.  In Feb, Home UPT and one at [facility] off [address], they were positive. No bleeding.

## 2015-09-03 ENCOUNTER — Encounter (HOSPITAL_COMMUNITY): Payer: Self-pay | Admitting: Family Medicine

## 2015-09-03 DIAGNOSIS — O9989 Other specified diseases and conditions complicating pregnancy, childbirth and the puerperium: Secondary | ICD-10-CM

## 2015-09-03 DIAGNOSIS — L0501 Pilonidal cyst with abscess: Secondary | ICD-10-CM

## 2015-09-03 MED ORDER — PROMETHAZINE HCL 25 MG PO TABS: 25.0000 mg | ORAL_TABLET | Freq: Four times a day (QID) | ORAL | Status: DC | PRN

## 2015-09-03 MED ORDER — OXYCODONE-ACETAMINOPHEN 5-325 MG PO TABS: 1.0000 | ORAL_TABLET | ORAL | Status: DC | PRN

## 2015-09-03 MED ORDER — SULFAMETHOXAZOLE-TRIMETHOPRIM 800-160 MG PO TABS: 1.0000 | ORAL_TABLET | Freq: Two times a day (BID) | ORAL | Status: DC

## 2015-09-03 NOTE — MAU Provider Note (Signed)
History    CSN: [id number] Arrival date and time: 09/02/15 2242 First Provider Initiated Contact with Patient 09/03/15 0025     Chief Complaint  Patient presents with  . Recurrent Skin Infections  . Abdominal Pain   HPI Patient is 20 y.o. G2P0010 [redacted]w[redacted]d here with complaints of a "boil on her but" and "stomach pain".  Boil: located at the top of gluteal cleft. Reports this has happened before and at that time she was given abx but no ID.  This is the 3rd reoccurrence. Reports this boil started 2-3 days ago, overall worsening. Denies fevers.   Stomach pain:  Across lower abdomen, intermittent. At its worse it is 5/10. Associated with back pain. Not currently not present. No aggravating or alleviating. Last BM was yesterday, soft.  Denies dysuria, polyuria. Denies hematuria. Describes vaginal discharge as white and clumpy. Denies vaginal itching.   Denies vaginal bleeding.  Reports her LMP was shorter period, reports regular period. Reports breast tenderness  OB History    Gravida Para Term Preterm AB TAB SAB Ectopic Multiple Living   Past Medical History  Diagnosis Date  . Asthma   . Miscarriage     Past Surgical History  Procedure Laterality Date  . Miscarriage    . Dilation and curettage of uterus     History reviewed. No pertinent family history.  Social History  Substance Use Topics  . Smoking status: Never Smoker   . Smokeless tobacco: Never Used  . Alcohol Use: No    Allergies:  Allergies  Allergen Reactions  . Zofran [Ondansetron] Other (See Comments)    Made under the tongue hurt/painful    Prescriptions prior to admission  Medication Sig Dispense Refill Last Dose  . cephALEXin (KEFLEX) 500 MG capsule Take 1 capsule (500 mg total) by mouth 2 (two) times daily. 14 capsule 0   . metroNIDAZOLE (FLAGYL) 500 MG tablet Take 1 tablet (500 mg total) by mouth 2 (two) times daily. (Patient not taking: Reported on 05/18/2015) 14 tablet 0 Completed  Course at Unknown time  . naproxen (NAPROSYN) 500 MG tablet Take 1 tablet (500 mg total) by mouth 2 (two) times daily. 20 tablet 0     Review of Systems  Constitutional: Negative for fever and chills.  HENT: Hearing loss: gets HA frequently, none in the last 2 weeks.   Eyes: Negative for blurred vision and double vision.  Respiratory: Negative for cough and shortness of breath.   Cardiovascular: Negative for chest pain and orthopnea.  Gastrointestinal: Positive for nausea, abdominal pain and diarrhea. Negative for vomiting, constipation and blood in stool.  Genitourinary: Negative for dysuria, frequency and flank pain.  Musculoskeletal: Negative for myalgias.  Skin: Negative for rash.  Neurological: Positive for headaches. Negative for dizziness, tingling and weakness.  Endo/Heme/Allergies: Does not bruise/bleed easily.  Psychiatric/Behavioral: Negative for depression and suicidal ideas. The patient is not nervous/anxious.    Physical Exam   Blood pressure 111/69, pulse 97, temperature 98.7 F (37.1 C), temperature source Oral, resp. rate 16, last menstrual period 07/25/2015, SpO2 100 %.  Physical Exam  Nursing note and vitals reviewed. Constitutional: She is oriented to person, place, and time. She appears well-developed and well-nourished. No distress.  Pregnant female  HENT:  Head: Normocephalic and atraumatic.  Eyes: Conjunctivae are normal. No scleral icterus.  Neck: Normal range of motion. Neck supple.  Cardiovascular: Normal rate and intact distal pulses.  Respiratory: Effort normal. She exhibits no tenderness.  GI: Soft. Bowel sounds are normal. She exhibits no distension. There is no tenderness. There is no rebound and no guarding.  Very soft throughout lower abdomen.   Musculoskeletal: Normal range of motion. She exhibits no edema.  Neurological: She is alert and oriented to person, place, and time.  Skin: Skin is warm and dry. No rash noted.     Psychiatric: She  has a normal mood and affect.    MAU Course  Procedures  MDM UA- negative UPT- POS  Assessment and Plan  Samon Dishner is a 20 y.o. G2P0010 at [redacted]w[redacted]d presenting primarily for an abscess  That seems consistent with pilonidal abscess esp given recurrent nature. Discussed options of conservative management vs ID. Patient opted  For conservative management.   #pilonidal abscess - bactrim DS x7 days - Warm compresses TID  - warm baths - RTC for ID if not improving  #stomach pain/back pain- unlikely pregnancy related - benign exam today. Do not feet pelvic US warranted at this time.  - ordered outpatient [place] for dating  #nausea - phenergan rx sent to pharmacy  Federico Flake 09/03/2015, 1:11 AM

## 2015-09-03 NOTE — Discharge Instructions (Signed)
Safe Medications in Pregnancy   Acne: Benzoyl Peroxide Salicylic Acid  Backache/Headache: Tylenol: 2 regular strength every 4 hours OR              2 Extra strength every 6 hours  Colds/Coughs/Allergies: Benadryl (alcohol free) 25 mg every 6 hours as needed Breath right strips Claritin Cepacol throat lozenges Chloraseptic throat spray Cold-Eeze- up to three times per day Cough drops, alcohol free Flonase (by prescription only) Guaifenesin Mucinex Robitussin DM (plain only, alcohol free) Saline nasal spray/drops Sudafed (pseudoephedrine) & Actifed ** use only after [redacted] weeks gestation and if you do not have high blood pressure Tylenol Vicks Vaporub Zinc lozenges Zyrtec   Constipation: Colace Ducolax suppositories Fleet enema Glycerin suppositories Metamucil Milk of magnesia Miralax Senokot Smooth move tea  Diarrhea: Kaopectate Imodium A-D  *NO pepto Bismol  Hemorrhoids: Anusol Anusol HC Preparation H Tucks  Indigestion: Tums Maalox Mylanta Zantac  Pepcid  Insomnia: Benadryl (alcohol free)  every 6 hours as needed Tylenol PM Unisom, no Gelcaps  Leg Cramps: Tums MagGel  Nausea/Vomiting:  Bonine Dramamine Emetrol Ginger extract Sea bands Meclizine  Nausea medication to take during pregnancy:  Unisom (doxylamine succinate 25 mg tablets) Take one tablet daily at bedtime. If symptoms are not adequately controlled, the dose can be increased to a maximum recommended dose of two tablets daily (1/2 tablet in the morning, 1/2 tablet mid-afternoon and one at bedtime). Vitamin B6  tablets. Take one tablet twice a day (up to 200 mg per day).  Skin Rashes: Aveeno products Benadryl cream or  every 6 hours as needed Calamine Lotion 1% cortisone cream  Yeast infection: Gyne-lotrimin 7 Monistat 7   **If taking multiple medications, please check labels to avoid duplicating the same active ingredients **take medication as directed on  the label ** Do not exceed 4000 mg of tylenol in 24 hours **Do not take medications that contain aspirin or ibuprofen      Pilonidal Cyst A pilonidal cyst is a fluid-filled sac. It forms beneath the skin near your tailbone, at the top of the crease of your buttocks. A pilonidal cyst that is not large or infected may not cause symptoms or problems. If the cyst becomes irritated or infected, it may fill with pus. This causes pain and swelling (pilonidal abscess). An infected cyst may need to be treated with medicine, drained, or removed. CAUSES The cause of a pilonidal cyst is not known. One cause may be a hair that grows into your skin (ingrown hair). RISK FACTORS Pilonidal cysts are more common in boys and men. Risk factors include:  Having lots of hair near the crease of the buttocks.  Being overweight.  Having a pilonidal dimple.  Wearing tight clothing.  Not bathing or showering frequently.  Sitting for long periods of time. SIGNS AND SYMPTOMS Signs and symptoms of a pilonidal cyst may include:  Redness.  Pain and tenderness.  Warmth.  Swelling.  Pus.  Fever. DIAGNOSIS Your health care provider may diagnose a pilonidal cyst based on your symptoms and a physical exam. The health care provider may do a blood test to check for infection. If your cyst is draining pus, your health care provider may take a sample of the drainage to be tested at a laboratory. TREATMENT Surgery is the usual treatment for an infected pilonidal cyst. You may also have to take medicines before surgery. The type of surgery you have depends on the size and severity of the infected cyst. The different kinds of surgery  include:  Incision and drainage. This is a procedure to open and drain the cyst.  Marsupialization. In this procedure, a large cyst or abscess may be opened and kept open by stitching the edges of the skin to the cyst walls.  Cyst removal. This procedure involves opening the skin  and removing all or part of the cyst. HOME CARE INSTRUCTIONS  Follow all of your surgeon's instructions carefully if you had surgery.  Take medicines only as directed by your health care provider.  If you were prescribed an antibiotic medicine, finish it all even if you start to feel better.  Keep the area around your pilonidal cyst clean and dry.  Clean the area as directed by your health care provider. Pat the area dry with a clean towel. Do not rub it as this may cause bleeding.  Remove hair from the area around the cyst as directed by your health care provider.  Do not wear tight clothing or sit in one place for long periods of time.  There are many different ways to close and cover an incision, including stitches, skin glue, and adhesive strips. Follow your health care provider's instructions on:  Incision care.  Bandage (dressing) changes and removal.  Incision closure removal. SEEK MEDICAL CARE IF:   You have drainage, redness, swelling, or pain at the site of the cyst.  You have a fever.   This information is not intended to replace advice given to you by your health care provider. Make sure you discuss any questions you have with your health care provider.   Document Released: 06/28/2000 Document Revised: 07/22/2014 Document Reviewed: 11/18/2013 Elsevier Interactive Patient Education 2016 [employer]  Abscess An abscess is an infected area that contains a collection of pus and debris.It can occur in almost any part of the body. An abscess is also known as a furuncle or boil. CAUSES  An abscess occurs when tissue gets infected. This can occur from blockage of oil or sweat glands, infection of hair follicles, or a minor injury to the skin. As the body tries to fight the infection, pus collects in the area and creates pressure under the skin. This pressure causes pain. People with weakened immune systems have difficulty fighting infections and get certain abscesses  more often.  SYMPTOMS Usually an abscess develops on the skin and becomes a painful mass that is red, warm, and tender. If the abscess forms under the skin, you may feel a moveable soft area under the skin. Some abscesses break open (rupture) on their own, but most will continue to get worse without care. The infection can spread deeper into the body and eventually into the bloodstream, causing you to feel ill.  DIAGNOSIS  Your caregiver will take your medical history and perform a physical exam. A sample of fluid may also be taken from the abscess to determine what is causing your infection. TREATMENT  Your caregiver may prescribe antibiotic medicines to fight the infection. However, taking antibiotics alone usually does not cure an abscess. Your caregiver may need to make a small cut (incision) in the abscess to drain the pus. In some cases, gauze is packed into the abscess to reduce pain and to continue draining the area. HOME CARE INSTRUCTIONS   Only take over-the-counter or prescription medicines for pain, discomfort, or fever as directed by your caregiver.  If you were prescribed antibiotics, take them as directed. Finish them even if you start to feel better.  If gauze is used, follow your  caregiver's directions for changing the gauze.  To avoid spreading the infection:  Keep your draining abscess covered with a bandage.  Wash your hands well.  Do not share personal care items, towels, or whirlpools with others.  Avoid skin contact with others.  Keep your skin and clothes clean around the abscess.  Keep all follow-up appointments as directed by your caregiver. SEEK MEDICAL CARE IF:   You have increased pain, swelling, redness, fluid drainage, or bleeding.  You have muscle aches, chills, or a general ill feeling.  You have a fever. MAKE SURE YOU:   Understand these instructions.  Will watch your condition.  Will get help right away if you are not doing well or get  worse.   This information is not intended to replace advice given to you by your health care provider. Make sure you discuss any questions you have with your health care provider.   Document Released: 04/10/2005 Document Revised: 12/31/2011 Document Reviewed: 09/13/2011 Elsevier Interactive Patient Education [employer]

## 2015-09-19 ENCOUNTER — Encounter: Payer: Self-pay | Admitting: Certified Nurse Midwife

## 2015-09-19 ENCOUNTER — Other Ambulatory Visit (HOSPITAL_COMMUNITY): Payer: Self-pay | Admitting: Family Medicine

## 2015-09-19 ENCOUNTER — Ambulatory Visit (HOSPITAL_COMMUNITY)
Admission: RE | Admit: 2015-09-19 | Discharge: 2015-09-19 | Disposition: A | Discharge status: Home / Self Care | Payer: Medicaid Other | Source: Ambulatory Visit | Attending: Family Medicine | Admitting: Family Medicine

## 2015-09-19 DIAGNOSIS — Z36 Encounter for antenatal screening of mother: Secondary | ICD-10-CM | POA: Insufficient documentation

## 2015-09-19 DIAGNOSIS — L0501 Pilonidal cyst with abscess: Secondary | ICD-10-CM

## 2015-09-19 DIAGNOSIS — Z3687 Encounter for antenatal screening for uncertain dates: Secondary | ICD-10-CM

## 2015-09-19 DIAGNOSIS — O3680X0 Pregnancy with inconclusive fetal viability, not applicable or unspecified: Secondary | ICD-10-CM

## 2015-09-19 DIAGNOSIS — Z3A13 13 weeks gestation of pregnancy: Secondary | ICD-10-CM | POA: Insufficient documentation

## 2015-09-25 ENCOUNTER — Encounter (HOSPITAL_COMMUNITY): Payer: Self-pay | Admitting: Family Medicine

## 2015-09-26 ENCOUNTER — Encounter: Payer: Medicaid Other | Admitting: Certified Nurse Midwife

## 2015-10-09 LAB — OB RESULTS CONSOLE RPR: RPR: NONREACTIVE

## 2015-10-09 LAB — OB RESULTS CONSOLE RUBELLA ANTIBODY, IGM: RUBELLA: IMMUNE

## 2015-10-09 LAB — OB RESULTS CONSOLE ABO/RH: RH Type: POSITIVE

## 2015-10-09 LAB — OB RESULTS CONSOLE HEPATITIS B SURFACE ANTIGEN: HEP B S AG: NEGATIVE

## 2015-10-09 LAB — OB RESULTS CONSOLE ANTIBODY SCREEN: ANTIBODY SCREEN: NEGATIVE

## 2015-10-17 ENCOUNTER — Other Ambulatory Visit: Payer: Self-pay | Admitting: Certified Nurse Midwife

## 2015-10-17 LAB — OB RESULTS CONSOLE HIV ANTIBODY (ROUTINE TESTING): HIV: NONREACTIVE

## 2015-11-11 ENCOUNTER — Inpatient Hospital Stay (HOSPITAL_COMMUNITY)
Admission: AD | Admit: 2015-11-11 | Discharge: 2015-11-11 | Disposition: A | Discharge status: Home / Self Care | Payer: Medicaid Other | Source: Ambulatory Visit | Attending: Obstetrics and Gynecology | Admitting: Obstetrics and Gynecology

## 2015-11-11 ENCOUNTER — Encounter (HOSPITAL_COMMUNITY): Payer: Self-pay | Admitting: Certified Nurse Midwife

## 2015-11-11 DIAGNOSIS — N898 Other specified noninflammatory disorders of vagina: Secondary | ICD-10-CM | POA: Insufficient documentation

## 2015-11-11 DIAGNOSIS — O98812 Other maternal infectious and parasitic diseases complicating pregnancy, second trimester: Secondary | ICD-10-CM

## 2015-11-11 DIAGNOSIS — B9689 Other specified bacterial agents as the cause of diseases classified elsewhere: Secondary | ICD-10-CM | POA: Diagnosis not present

## 2015-11-11 DIAGNOSIS — R300 Dysuria: Secondary | ICD-10-CM | POA: Insufficient documentation

## 2015-11-11 DIAGNOSIS — N760 Acute vaginitis: Secondary | ICD-10-CM | POA: Insufficient documentation

## 2015-11-11 DIAGNOSIS — B3731 Acute candidiasis of vulva and vagina: Secondary | ICD-10-CM

## 2015-11-11 DIAGNOSIS — A499 Bacterial infection, unspecified: Secondary | ICD-10-CM | POA: Diagnosis not present

## 2015-11-11 DIAGNOSIS — O23592 Infection of other part of genital tract in pregnancy, second trimester: Secondary | ICD-10-CM

## 2015-11-11 DIAGNOSIS — O2342 Unspecified infection of urinary tract in pregnancy, second trimester: Secondary | ICD-10-CM | POA: Diagnosis not present

## 2015-11-11 DIAGNOSIS — J45909 Unspecified asthma, uncomplicated: Secondary | ICD-10-CM | POA: Insufficient documentation

## 2015-11-11 DIAGNOSIS — O26892 Other specified pregnancy related conditions, second trimester: Secondary | ICD-10-CM | POA: Diagnosis not present

## 2015-11-11 DIAGNOSIS — B373 Candidiasis of vulva and vagina: Secondary | ICD-10-CM

## 2015-11-11 DIAGNOSIS — R1030 Lower abdominal pain, unspecified: Secondary | ICD-10-CM | POA: Diagnosis not present

## 2015-11-11 DIAGNOSIS — Z3A20 20 weeks gestation of pregnancy: Secondary | ICD-10-CM | POA: Insufficient documentation

## 2015-11-11 HISTORY — DX: Anemia, unspecified: D64.9

## 2015-11-11 LAB — URINALYSIS, ROUTINE W REFLEX MICROSCOPIC
BILIRUBIN URINE: NEGATIVE
GLUCOSE, UA: NEGATIVE mg/dL
HGB URINE DIPSTICK: NEGATIVE
KETONES UR: NEGATIVE mg/dL
Nitrite: NEGATIVE
PROTEIN: NEGATIVE mg/dL
Specific Gravity, Urine: 1.015 (ref 1.005–1.030)
pH: 6 (ref 5.0–8.0)

## 2015-11-11 LAB — URINE MICROSCOPIC-ADD ON: RBC / HPF: NONE SEEN RBC/hpf (ref 0–5)

## 2015-11-11 LAB — WET PREP, GENITAL
Sperm: NONE SEEN
Trich, Wet Prep: NONE SEEN

## 2015-11-11 MED ORDER — CEPHALEXIN 500 MG PO CAPS: 500.0000 mg | ORAL_CAPSULE | Freq: Two times a day (BID) | ORAL | Status: AC

## 2015-11-11 MED ORDER — METRONIDAZOLE 500 MG PO TABS: 500.0000 mg | ORAL_TABLET | Freq: Two times a day (BID) | ORAL | Status: AC

## 2015-11-11 NOTE — MAU Note (Signed)
Pt states she has burning on urination and abdominal pain. Pt states she has had some brownish vaginal discharge. Pt denies leaking of fluid or vaginal bleeding. Fetus is active. Pt states "today the inside of my vag started hurting"

## 2015-11-11 NOTE — MAU Provider Note (Signed)
History     CSN: [id number]  Arrival date and time: 11/11/15 1428   First Provider Initiated Contact with Patient 11/11/15 1510      Chief Complaint  Patient presents with  . Urinary Frequency   HPI  Ms. Traci Spears is a 20yo G2P0010 at 20+5 who presents with 1 week of dysuria and vaginal discharge.  She has had pain in her lower abdomen, L>R, with urination.  She denies fevers/chills or back pain, though she does have nausea sometimes.  She also has had a yellowish discharge with a bad smell for the past week.  It is itchy.  She denies vaginal bleeding.     Past Medical History  Diagnosis Date  . Asthma   . Miscarriage   . Anemia     Past Surgical History  Procedure Laterality Date  . Miscarriage    . Dilation and curettage of uterus      History reviewed. No pertinent family history.  Social History  Substance Use Topics  . Smoking status: Never Smoker   . Smokeless tobacco: Never Used  . Alcohol Use: No    Allergies:  Allergies  Allergen Reactions  . Zofran [Ondansetron] Other (See Comments)    Made under the tongue hurt/painful    Prescriptions prior to admission  Medication Sig Dispense Refill Last Dose  . oxyCODONE-acetaminophen (ROXICET) 5-325 MG tablet Take 1 tablet by mouth every 4 (four) hours as needed for severe pain (Take before cleaning area). 5 tablet 0   . promethazine (PHENERGAN) 25 MG tablet Take 1 tablet (25 mg total) by mouth every 6 (six) hours as needed for nausea or vomiting. 30 tablet 2   . sulfamethoxazole-trimethoprim (BACTRIM DS,SEPTRA DS) 800-160 MG tablet Take 1 tablet by mouth 2 (two) times daily. 14 tablet 1     ROS  No fevers/chills No headaches No shortness of breath or chest pain No vomiting No hematuria No rash Physical Exam   Blood pressure 118/67, pulse 91, temperature 98.4 F (36.9 C), temperature source Oral, resp. rate 20, height 5\' 1"  (1.549 m), weight 158 lb 12.8 oz (72.031 kg), last menstrual period  07/25/2015.  Physical Exam  Gen: alert, in NAD HEENT: NCAT, normal conjunctivae, moist oral mucosa Chest: normal WOB, lungs CTAB CV: normal rate and regular rhythm, normal S1 and S2, no m/r/g Abd: mildly tender to palpation in lower abdomen, L>R and also suprapubically, no CVA tenderness GU: normal external female genitalia, moist vaginal mucosa with moderate creamy white discharge, cervix closed and without lesions Ext: no pedal edema Skin: no rashes or lesions noted Psych: cooperative, appropriate affect  FHTs: 150bpm by doptone  Results for orders placed or performed during the hospital encounter of 11/11/15 (from the past 24 hour(s))  Urinalysis, Routine w reflex microscopic ([facility])     Status: Abnormal   Collection Time: 11/11/15  3:06 PM  Result Value Ref Range   Color, Urine YELLOW YELLOW   APPearance CLEAR CLEAR   Specific Gravity, Urine 1.015 1.005 - 1.030   pH 6.0 5.0 - 8.0   Glucose, UA NEGATIVE NEGATIVE mg/dL   Hgb urine dipstick NEGATIVE NEGATIVE   Bilirubin Urine NEGATIVE NEGATIVE   Ketones, ur NEGATIVE NEGATIVE mg/dL   Protein, ur NEGATIVE NEGATIVE mg/dL   Nitrite NEGATIVE NEGATIVE   Leukocytes, UA LARGE (A) NEGATIVE  Urine microscopic-add on     Status: Abnormal   Collection Time: 11/11/15  3:06 PM  Result Value Ref Range   Squamous Epithelial / LPF  6-30 (A) NONE SEEN   WBC, UA 6-30 0 - 5 WBC/hpf   RBC / HPF NONE SEEN 0 - 5 RBC/hpf   Bacteria, UA FEW (A) NONE SEEN   Urine-Other MUCOUS PRESENT   Wet prep, genital     Status: Abnormal   Collection Time: 11/11/15  3:20 PM  Result Value Ref Range   Yeast Wet Prep HPF POC PRESENT (A) NONE SEEN   Trich, Wet Prep NONE SEEN NONE SEEN   Clue Cells Wet Prep HPF POC PRESENT (A) NONE SEEN   WBC, Wet Prep HPF POC TOO NUMEROUS TO COUNT (A) NONE SEEN   Sperm NONE SEEN     MAU Course  Procedures  MDM 19yo at 20+5 here with concern for UTI and/or vaginal infection.  UA with large LE.  Wet prep with yeast and  clue cells as well as WBCs.  GC/CT collected.  Assessment and Plan  19yo G2P0010 at 20+5 here with lower abdominal pain and   Discharge home with cephalexin for presumed UTI, metronidazole for BV, and recommendation for Monistat-7 for yeast infection. UCx ordered. Keep previously scheduled f/u at [facility]. Discharge home.  Caesar Chestnut 11/11/2015, 3:24 PM   I spoke with and examined patient and agree with resident/PA/SNM's note and plan of care.  G2P0010 [redacted]w[redacted]d w/ report of dysuria and foul smelling d/c w/ itching x 1 week. Reports +fm. Denies uc's, lof, vb.  Rx keflex for uti- send urine cx, metronidazole for BV, otc monistat 7 for yeast, no sex x 1 week.  Reviewed ptl s/s, fm Keep next appt at [facility] on Tues Cheral Marker, CNM, [facility] 11/11/2015 4:28 PM

## 2015-11-11 NOTE — Discharge Instructions (Signed)
Pregnancy and Urinary Tract Infection A urinary tract infection (UTI) is a bacterial infection of the urinary tract. Infection of the urinary tract can include the ureters, kidneys (pyelonephritis), bladder (cystitis), and urethra (urethritis). All pregnant women should be screened for bacteria in the urinary tract. Identifying and treating a UTI will decrease the risk of preterm labor and developing more serious infections in both the mother and baby. CAUSES Bacteria germs cause almost all UTIs.  RISK FACTORS Many factors can increase your chances of getting a UTI during pregnancy. These include:  Having a short urethra.  Poor toilet and hygiene habits.  Sexual intercourse.  Blockage of urine along the urinary tract.  Problems with the pelvic muscles or nerves.  Diabetes.  Obesity.  Bladder problems after having several children.  Previous history of UTI. SIGNS AND SYMPTOMS   Pain, burning, or a stinging feeling when urinating.  Suddenly feeling the need to urinate right away (urgency).  Loss of bladder control (urinary incontinence).  Frequent urination, more than is common with pregnancy.  Lower abdominal or back discomfort.  Cloudy urine.  Blood in the urine (hematuria).  Fever. When the kidneys are infected, the symptoms may be:  Back pain.  Flank pain on the right side more so than the left.  Fever.  Chills.  Nausea.  Vomiting. DIAGNOSIS  A urinary tract infection is usually diagnosed through urine tests. Additional tests and procedures are sometimes done. These may include:  Ultrasound exam of the kidneys, ureters, bladder, and urethra.  Looking in the bladder with a lighted tube (cystoscopy). TREATMENT Typically, UTIs can be treated with antibiotic medicines.  HOME CARE INSTRUCTIONS   Only take over-the-counter or prescription medicines as directed by your health care provider. If you were prescribed antibiotics, take them as directed. Finish  them even if you start to feel better.  Drink enough fluids to keep your urine clear or pale yellow.  Do not have sexual intercourse until the infection is gone and your health care provider says it is okay.  Make sure you are tested for UTIs throughout your pregnancy. These infections often come back. Preventing a UTI in the Future  Practice good toilet habits. Always wipe from front to back. Use the tissue only once.  Do not hold your urine. Empty your bladder as soon as possible when the urge comes.  Do not douche or use deodorant sprays.  Wash with soap and warm water around the genital area and the anus.  Empty your bladder before and after sexual intercourse.  Wear underwear with a cotton crotch.  Avoid caffeine and carbonated drinks. They can irritate the bladder.  Drink cranberry juice or take cranberry pills. This may decrease the risk of getting a UTI.  Do not drink alcohol.  Keep all your appointments and tests as scheduled. SEEK MEDICAL CARE IF:   Your symptoms get worse.  You are still having fevers 2 or more days after treatment begins.  You have a rash.  You feel that you are having problems with medicines prescribed.  You have abnormal vaginal discharge. SEEK IMMEDIATE MEDICAL CARE IF:   You have back or flank pain.  You have chills.  You have blood in your urine.  You have nausea and vomiting.  You have contractions of your uterus.  You have a gush of fluid from the vagina. MAKE SURE YOU: Understand theseMonilial Vaginitis Vaginitis in a soreness, swelling and redness (inflammation) of the vagina and vulva. Monilial vaginitis is not a sexually  transmitted infection. CAUSES  Yeast vaginitis is caused by yeast (candida) that is normally found in your vagina. With a yeast infection, the candida has overgrown in number to a point that upsets the chemical balance. SYMPTOMS  White, thick vaginal discharge. Swelling, itching, redness and  irritation of the vagina and possibly the lips of the vagina (vulva). Burning or painful urination. Painful intercourse. DIAGNOSIS  Things that may contribute to monilial vaginitis are: Postmenopausal and virginal states. Pregnancy. Infections. Being tired, sick or stressed, especially if you had monilial vaginitis in the past. Diabetes. Good control will help lower the chance. Birth control pills. Tight fitting garments. Using bubble bath, feminine sprays, douches or deodorant tampons. Taking certain medications that kill germs (antibiotics). Sporadic recurrence can occur if you become ill. TREATMENT  Purchase Monistat-7 or other over-the-counter anti-yeast cream to use vaginally at night. Eating yogurt may help prevent monilial vaginitis. HOME CARE INSTRUCTIONS  Finish all medication as prescribed. Do not have sex until treatment is completed or after your caregiver tells you it is okay. Take warm sitz baths. Do not douche. Do not use tampons, especially scented ones. Wear cotton underwear. Avoid tight pants and panty hose. Tell your sexual partner that you have a yeast infection. They should go to their caregiver if they have symptoms such as mild rash or itching. Your sexual partner should be treated as well if your infection is difficult to eliminate. Practice safer sex. Use condoms. Some vaginal medications cause latex condoms to fail. Vaginal medications that harm condoms are: Cleocin cream. Butoconazole (Femstat). Terconazole (Terazol) vaginal suppository. Miconazole (Monistat) (may be purchased over the counter). SEEK MEDICAL CARE IF:  You have a temperature by mouth above 102 F (38.9 C). The infection is getting worse after 2 days of treatment. The infection is not getting better after 3 days of treatment. You develop blisters in or around your vagina. You develop vaginal bleeding, and it is not your menstrual period. You have pain when you urinate. You develop  intestinal problems. You have pain with sexual intercourse.   This information is not intended to replace advice given to you by your health care provider. Make sure you discuss any questions you have with your health care provider.   Document Released: 04/10/2005 Document Revised: 09/23/2011 Document Reviewed: 01/02/2015 Elsevier Interactive Patient Education [employer]   instructions.   Will watch your condition.   Will get help right away if you are not doing well or get worse.    This information is not intended to replace advice given to you by your health care provider. Make sure you discuss any questions you have with your health care provider.   Document Released: 10/26/2010 Document Revised: 04/21/2013 Document Reviewed: 01/28/2013 Elsevier Interactive Patient Education [employer]

## 2015-11-13 LAB — GC/CHLAMYDIA PROBE AMP (~~LOC~~) NOT AT ARMC
Chlamydia: NEGATIVE
Neisseria Gonorrhea: NEGATIVE

## 2015-11-13 LAB — CULTURE, OB URINE: SPECIAL REQUESTS: NORMAL

## 2015-12-13 LAB — OB RESULTS CONSOLE GC/CHLAMYDIA
CHLAMYDIA, DNA PROBE: NEGATIVE
Gonorrhea: NEGATIVE

## 2015-12-29 ENCOUNTER — Encounter (HOSPITAL_COMMUNITY): Payer: Self-pay | Admitting: *Deleted

## 2015-12-29 ENCOUNTER — Inpatient Hospital Stay (HOSPITAL_COMMUNITY)
Admission: AD | Admit: 2015-12-29 | Discharge: 2015-12-29 | Disposition: A | Discharge status: Home / Self Care | Payer: No Typology Code available for payment source | Source: Ambulatory Visit | Attending: Obstetrics & Gynecology | Admitting: Obstetrics & Gynecology

## 2015-12-29 ENCOUNTER — Inpatient Hospital Stay (HOSPITAL_COMMUNITY): Payer: No Typology Code available for payment source

## 2015-12-29 DIAGNOSIS — O26892 Other specified pregnancy related conditions, second trimester: Secondary | ICD-10-CM | POA: Insufficient documentation

## 2015-12-29 DIAGNOSIS — Z3A27 27 weeks gestation of pregnancy: Secondary | ICD-10-CM | POA: Insufficient documentation

## 2015-12-29 DIAGNOSIS — R51 Headache: Secondary | ICD-10-CM | POA: Insufficient documentation

## 2015-12-29 HISTORY — DX: Headache, unspecified: R51.9

## 2015-12-29 HISTORY — DX: Headache: R51

## 2015-12-29 MED ORDER — ACETAMINOPHEN 500 MG PO TABS
1000.0000 mg | ORAL_TABLET | Freq: Once | ORAL | Status: AC
  Administered 2015-12-29: 1000 mg via ORAL
  Filled 2015-12-29: qty 2

## 2015-12-29 MED ORDER — HYDROMORPHONE HCL 1 MG/ML IJ SOLN: 0.5000 mg | INTRAMUSCULAR | Status: DC | PRN

## 2015-12-29 MED ORDER — CYCLOBENZAPRINE HCL 5 MG PO TABS: 5.0000 mg | ORAL_TABLET | Freq: Three times a day (TID) | ORAL | Status: DC | PRN

## 2015-12-29 MED ORDER — CYCLOBENZAPRINE HCL 5 MG PO TABS
5.0000 mg | ORAL_TABLET | Freq: Three times a day (TID) | ORAL | Status: DC | PRN
Start: 2015-12-29 — End: 2015-12-29
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
## 2016-02-28 LAB — OB RESULTS CONSOLE GBS: GBS: POSITIVE

## 2016-03-17 ENCOUNTER — Inpatient Hospital Stay (HOSPITAL_COMMUNITY)
Admission: AD | Admit: 2016-03-17 | Discharge: 2016-03-19 | DRG: 775 | Disposition: A | Discharge status: Home / Self Care | Payer: Medicaid Other | Source: Ambulatory Visit | Attending: Obstetrics & Gynecology | Admitting: Obstetrics & Gynecology

## 2016-03-17 ENCOUNTER — Inpatient Hospital Stay (HOSPITAL_COMMUNITY): Payer: Medicaid Other | Admitting: Anesthesiology

## 2016-03-17 ENCOUNTER — Encounter (HOSPITAL_COMMUNITY): Payer: Self-pay

## 2016-03-17 DIAGNOSIS — O99824 Streptococcus B carrier state complicating childbirth: Secondary | ICD-10-CM | POA: Diagnosis present

## 2016-03-17 DIAGNOSIS — Z3A38 38 weeks gestation of pregnancy: Secondary | ICD-10-CM | POA: Diagnosis not present

## 2016-03-17 DIAGNOSIS — IMO0001 Reserved for inherently not codable concepts without codable children: Secondary | ICD-10-CM

## 2016-03-17 DIAGNOSIS — Z3403 Encounter for supervision of normal first pregnancy, third trimester: Secondary | ICD-10-CM | POA: Diagnosis present

## 2016-03-17 LAB — TYPE AND SCREEN
ABO/RH(D): O POS
ANTIBODY SCREEN: NEGATIVE

## 2016-03-17 LAB — CBC
HEMATOCRIT: 32.6 % — AB (ref 36.0–46.0)
HEMOGLOBIN: 11.1 g/dL — AB (ref 12.0–15.0)
MCH: 29.1 pg (ref 26.0–34.0)
MCHC: 34 g/dL (ref 30.0–36.0)
MCV: 85.3 fL (ref 78.0–100.0)
Platelets: 213 10*3/uL (ref 150–400)
RBC: 3.82 MIL/uL — AB (ref 3.87–5.11)
RDW: 14.3 % (ref 11.5–15.5)
WBC: 8.9 10*3/uL (ref 4.0–10.5)

## 2016-03-17 LAB — RAPID URINE DRUG SCREEN, HOSP PERFORMED
Amphetamines: NOT DETECTED
BARBITURATES: NOT DETECTED
BENZODIAZEPINES: NOT DETECTED
Cocaine: NOT DETECTED
Opiates: NOT DETECTED
TETRAHYDROCANNABINOL: NOT DETECTED

## 2016-03-17 LAB — ABO/RH: ABO/RH(D): O POS

## 2016-03-17 MED ORDER — PHENYLEPHRINE 40 MCG/ML (10ML) SYRINGE FOR IV PUSH (FOR BLOOD PRESSURE SUPPORT)
80.0000 ug | PREFILLED_SYRINGE | INTRAVENOUS | Status: DC | PRN
  Filled 2016-03-17: qty 10

## 2016-03-17 MED ORDER — PENICILLIN G POTASSIUM 5000000 UNITS IJ SOLR
2.5000 10*6.[IU] | INTRAVENOUS | Status: DC
  Administered 2016-03-17 – 2016-03-18 (×2): 2.5 10*6.[IU] via INTRAVENOUS
  Filled 2016-03-17 (×4): qty 2.5

## 2016-03-17 MED ORDER — PENICILLIN G POTASSIUM 5000000 UNITS IJ SOLR: 2.5000 10*6.[IU] | INTRAMUSCULAR | Status: DC

## 2016-03-17 MED ORDER — FLEET ENEMA 7-19 GM/118ML RE ENEM: 1.0000 | ENEMA | RECTAL | Status: DC | PRN

## 2016-03-17 MED ORDER — OXYTOCIN BOLUS FROM INFUSION: 500.0000 mL | Freq: Once | INTRAVENOUS | Status: DC

## 2016-03-17 MED ORDER — OXYCODONE-ACETAMINOPHEN 5-325 MG PO TABS: 1.0000 | ORAL_TABLET | ORAL | Status: DC | PRN

## 2016-03-17 MED ORDER — LACTATED RINGERS IV SOLN: 500.0000 mL | Freq: Once | INTRAVENOUS | Status: DC

## 2016-03-17 MED ORDER — PENICILLIN G POTASSIUM 5000000 UNITS IJ SOLR
5.0000 10*6.[IU] | Freq: Once | INTRAVENOUS | Status: AC
  Administered 2016-03-17: 5 10*6.[IU] via INTRAVENOUS
  Filled 2016-03-17: qty 5

## 2016-03-17 MED ORDER — LACTATED RINGERS IV SOLN: 500.0000 mL | INTRAVENOUS | Status: DC | PRN

## 2016-03-17 MED ORDER — LIDOCAINE HCL (PF) 1 % IJ SOLN
30.0000 mL | INTRAMUSCULAR | Status: DC | PRN
  Filled 2016-03-17: qty 30

## 2016-03-17 MED ORDER — PHENYLEPHRINE 40 MCG/ML (10ML) SYRINGE FOR IV PUSH (FOR BLOOD PRESSURE SUPPORT): 80.0000 ug | PREFILLED_SYRINGE | INTRAVENOUS | Status: DC | PRN

## 2016-03-17 MED ORDER — ACETAMINOPHEN 325 MG PO TABS: 650.0000 mg | ORAL_TABLET | ORAL | Status: DC | PRN

## 2016-03-17 MED ORDER — PENICILLIN G POTASSIUM 5000000 UNITS IJ SOLR: 5.0000 10*6.[IU] | Freq: Once | INTRAMUSCULAR | Status: DC

## 2016-03-17 MED ORDER — FENTANYL 2.5 MCG/ML BUPIVACAINE 1/10 % EPIDURAL INFUSION (WH - ANES)
14.0000 mL/h | INTRAMUSCULAR | Status: DC | PRN
  Administered 2016-03-17 – 2016-03-18 (×3): 14 mL/h via EPIDURAL
  Filled 2016-03-17 (×2): qty 125

## 2016-03-17 MED ORDER — LACTATED RINGERS IV SOLN
INTRAVENOUS | Status: DC
  Administered 2016-03-17 (×2): via INTRAVENOUS

## 2016-03-17 MED ORDER — FENTANYL CITRATE (PF) 100 MCG/2ML IJ SOLN
100.0000 ug | INTRAMUSCULAR | Status: DC | PRN
  Administered 2016-03-17: 100 ug via INTRAVENOUS
  Filled 2016-03-17: qty 2

## 2016-03-17 MED ORDER — DIPHENHYDRAMINE HCL 50 MG/ML IJ SOLN: 12.5000 mg | INTRAMUSCULAR | Status: DC | PRN

## 2016-03-17 MED ORDER — EPHEDRINE 5 MG/ML INJ: 10.0000 mg | INTRAVENOUS | Status: DC | PRN

## 2016-03-17 MED ORDER — OXYCODONE-ACETAMINOPHEN 5-325 MG PO TABS: 2.0000 | ORAL_TABLET | ORAL | Status: DC | PRN

## 2016-03-17 MED ORDER — OXYTOCIN 40 UNITS IN LACTATED RINGERS INFUSION - SIMPLE MED
2.5000 [IU]/h | INTRAVENOUS | Status: DC
  Filled 2016-03-17: qty 1000

## 2016-03-17 MED ORDER — LIDOCAINE HCL (PF) 1 % IJ SOLN
INTRAMUSCULAR | Status: DC | PRN
  Administered 2016-03-17 (×2): 4 mL

## 2016-03-17 MED ORDER — SOD CITRATE-CITRIC ACID 500-334 MG/5ML PO SOLN: 30.0000 mL | ORAL | Status: DC | PRN

## 2016-03-17 NOTE — MAU Note (Signed)
Regular contractions since this morning 4 to 6 minutes apart, having pink discharge, denies LOF, positive FM

## 2016-03-17 NOTE — H&P (Signed)
Traci Spears is a 20 y.o. female G2P0010 @ 38.6 wks presenting with regular ctx's since 1200. Denies any vag bleeding or ROM. GBS pos.  OB History    Gravida Para Term Preterm AB Living   2       1     SAB TAB Ectopic Multiple Live Births   1             Past Medical History:  Diagnosis Date  . Anemia   . Asthma   . Headache   . Miscarriage    Past Surgical History:  Procedure Laterality Date  . DILATION AND CURETTAGE OF UTERUS    . miscarriage     Family History: family history is not on file. Social History:  reports that she has never smoked. She has never used smokeless tobacco. She reports that she does not drink alcohol or use drugs.     Maternal Diabetes: No Genetic Screening: Normal Maternal Ultrasounds/Referrals: Normal Fetal Ultrasounds or other Referrals:  None Maternal Substance Abuse:  Yes:  Type: Marijuana Significant Maternal Medications:  None Significant Maternal Lab Results:  None Other Comments:  None  Review of Systems  Constitutional: Negative.   HENT: Negative.   Eyes: Negative.   Respiratory: Negative.   Cardiovascular: Negative.   Gastrointestinal: Positive for abdominal pain.  Genitourinary: Negative.   Musculoskeletal: Negative.   Skin: Negative.   Neurological: Negative.   Endo/Heme/Allergies: Negative.   Psychiatric/Behavioral: Negative.    Maternal Medical History:  Reason for admission: Contractions.   Contractions: Onset was 6-12 hours ago.   Frequency: regular.   Perceived severity is moderate.    Fetal activity: Perceived fetal activity is normal.   Last perceived fetal movement was within the past hour.    Prenatal complications: Substance abuse.   Prenatal Complications - Diabetes: none.    Dilation: 4 Effacement (%): 90 Station: -1 Exam by:: Dorisann FramesAmanda Jones RN Blood pressure 105/63, pulse 85, temperature 98.1 F (36.7 C), temperature source Oral, resp. rate 18, height 5\' 1"  (1.549 m), weight 167 lb (75.8 kg), last  menstrual period 07/25/2015. Maternal Exam:  Uterine Assessment: Contraction strength is moderate.  Contraction frequency is regular.   Abdomen: Patient reports no abdominal tenderness. Estimated fetal weight is 7-5.   Fetal presentation: vertex  Introitus: Normal vulva. Normal vagina.  Ferning test: not done.  Nitrazine test: not done. Amniotic fluid character: not assessed.  Pelvis: adequate for delivery.   Cervix: Cervix evaluated by digital exam.     Fetal Exam Fetal Monitor Review: Mode: ultrasound.   Baseline rate: 120's .  Variability: moderate (6-25 bpm).   Pattern: accelerations present and no decelerations.    Fetal State Assessment: Category I - tracings are normal.     Physical Exam  Constitutional: She is oriented to person, place, and time. She appears well-developed and well-nourished.  HENT:  Head: Normocephalic.  Eyes: Pupils are equal, round, and reactive to light.  Neck: Normal range of motion.  Cardiovascular: Normal rate and regular rhythm.   Respiratory: Effort normal and breath sounds normal.  GI: Soft. Bowel sounds are normal.  Genitourinary: Vagina normal and uterus normal.  Musculoskeletal: Normal range of motion.  Neurological: She is alert and oriented to person, place, and time. She has normal reflexes.  Skin: Skin is warm and dry.  Psychiatric: She has a normal mood and affect. Her behavior is normal. Judgment and thought content normal.    Prenatal labs: ABO, Rh:   Antibody:   Rubella:  RPR:    HBsAg:    HIV: Non Reactive (11/03 0654)  GBS:     Assessment/Plan: Pregnancy @ 38.6 wks, G2P0010, presents with regular ctx's Q 4-5 mins since 1200. SVE 4/80/-1 BBOW. GBS pos. Will admit to L&D and start antibiotics.   Renetta Chalk 03/17/2016, 6:18 PM

## 2016-03-17 NOTE — MAU Note (Addendum)
Report given to [facility] CNM [facility]/Traci Hart [place] CNM patient G2P0 4257w6d GBS+, 3/80/-1 BBOW uncomfortable contractions every 2-3. CNM called back received order to ambulate patient for 1 hour and recheck.

## 2016-03-17 NOTE — Progress Notes (Signed)
LABOR PROGRESS NOTE  Traci Spears is a 20 y.o. G2P0010 at 4183w6d  admitted for SOL  Subjective: Doing well, more comfortable with epidural.  Objective: BP 121/73   Pulse 94   Temp 98.3 F (36.8 C) (Oral)   Resp 16   Ht 5\' 1"  (1.549 m)   Wt 75.8 kg (167 lb)   LMP 07/25/2015   SpO2 100%   BMI 31.55 kg/m  or  Vitals:   03/17/16 2101 03/17/16 2106 03/17/16 2111 03/17/16 2115  BP:    121/73  Pulse:    94  Resp: 16 16 16 16   Temp:      TempSrc:      SpO2:    100%  Weight:      Height:         Dilation: 4.5 Effacement (%): 90 Cervical Position: Middle Station: -1 Presentation: Vertex Exam by:: Auriel RN  FHT: 130 baseline, moderate variability, no accelerations, no decelerations Uterine activity: q3-494min  Labs: Lab Results  Component Value Date   WBC 8.9 03/17/2016   HGB 11.1 (L) 03/17/2016   HCT 32.6 (L) 03/17/2016   MCV 85.3 03/17/2016   PLT 213 03/17/2016    Patient Active Problem List   Diagnosis Date Noted  . Active labor at term 03/17/2016    Assessment / Plan: 20 y.o. G2P0010 at 5883w6d here for SOL  Labor: expectant management Fetal Wellbeing:  Category I  Pain Control:  epidural Anticipated MOD:  SVD  Leland HerElsia J Yoo, DO PGY-1 9/3/20179:33 PM

## 2016-03-17 NOTE — Anesthesia Preprocedure Evaluation (Signed)
Anesthesia Evaluation  Patient identified by MRN, date of birth, ID band Patient awake    Reviewed: Allergy & Precautions, NPO status , Patient's Chart, lab work & pertinent test results  History of Anesthesia Complications Negative for: history of anesthetic complications  Airway Mallampati: II  TM Distance: >3 FB Neck ROM: Full    Dental no notable dental hx. (+) Dental Advisory Given   Pulmonary asthma ,    Pulmonary exam normal breath sounds clear to auscultation       Cardiovascular negative cardio ROS Normal cardiovascular exam Rhythm:Regular Rate:Normal     Neuro/Psych  Headaches, negative psych ROS   GI/Hepatic negative GI ROS, Neg liver ROS,   Endo/Other  obesity  Renal/GU negative Renal ROS  negative genitourinary   Musculoskeletal negative musculoskeletal ROS (+)   Abdominal   Peds negative pediatric ROS (+)  Hematology  (+) anemia ,   Anesthesia Other Findings   Reproductive/Obstetrics (+) Pregnancy                             Anesthesia Physical Anesthesia Plan  ASA: II  Anesthesia Plan: Epidural   Post-op Pain Management:    Induction:   Airway Management Planned:   Additional Equipment:   Intra-op Plan:   Post-operative Plan:   Informed Consent: I have reviewed the patients History and Physical, chart, labs and discussed the procedure including the risks, benefits and alternatives for the proposed anesthesia with the patient or authorized representative who has indicated his/her understanding and acceptance.   Dental advisory given  Plan Discussed with: CRNA  Anesthesia Plan Comments:         Anesthesia Quick Evaluation

## 2016-03-17 NOTE — Anesthesia Procedure Notes (Signed)
Epidural Patient location during procedure: OB  Staffing Anesthesiologist: Nelline Lio Performed: anesthesiologist   Preanesthetic Checklist Completed: patient identified, site marked, surgical consent, pre-op evaluation, timeout performed, IV checked, risks and benefits discussed and monitors and equipment checked  Epidural Patient position: sitting Prep: site prepped and draped and DuraPrep Patient monitoring: continuous pulse ox and blood pressure Approach: midline Location: L3-L4 Injection technique: LOR saline  Needle:  Needle type: Tuohy  Needle gauge: 17 G Needle length: 9 cm and 9 Needle insertion depth: 5 cm cm Catheter type: closed end flexible Catheter size: 19 Gauge Catheter at skin depth: 10 cm Test dose: negative  Assessment Events: blood not aspirated, injection not painful, no injection resistance, negative IV test and no paresthesia  Additional Notes Patient identified. Risks/Benefits/Options discussed with patient including but not limited to bleeding, infection, nerve damage, paralysis, failed block, incomplete pain control, headache, blood pressure changes, nausea, vomiting, reactions to medication both or allergic, itching and postpartum back pain. Confirmed with bedside nurse the patient's most recent platelet count. Confirmed with patient that they are not currently taking any anticoagulation, have any bleeding history or any family history of bleeding disorders. Patient expressed understanding and wished to proceed. All questions were answered. Sterile technique was used throughout the entire procedure. Please see nursing notes for vital signs. Test dose was given through epidural catheter and negative prior to continuing to dose epidural or start infusion. Warning signs of high block given to the patient including shortness of breath, tingling/numbness in hands, complete motor block, or any concerning symptoms with instructions to call for help. Patient was  given instructions on fall risk and not to get out of bed. All questions and concerns addressed with instructions to call with any issues or inadequate analgesia.        

## 2016-03-18 ENCOUNTER — Encounter (HOSPITAL_COMMUNITY): Payer: Self-pay

## 2016-03-18 DIAGNOSIS — O99824 Streptococcus B carrier state complicating childbirth: Secondary | ICD-10-CM

## 2016-03-18 DIAGNOSIS — Z3A38 38 weeks gestation of pregnancy: Secondary | ICD-10-CM

## 2016-03-18 LAB — RPR: RPR: NONREACTIVE

## 2016-03-18 MED ORDER — ACETAMINOPHEN 325 MG PO TABS
650.0000 mg | ORAL_TABLET | ORAL | Status: DC | PRN
  Administered 2016-03-18: 650 mg via ORAL
  Filled 2016-03-18 (×2): qty 2

## 2016-03-18 MED ORDER — DIBUCAINE 1 % RE OINT: 1.0000 "application " | TOPICAL_OINTMENT | RECTAL | Status: DC | PRN

## 2016-03-18 MED ORDER — OXYCODONE HCL 5 MG PO TABS
5.0000 mg | ORAL_TABLET | ORAL | Status: DC | PRN
  Administered 2016-03-18: 5 mg via ORAL
  Filled 2016-03-18: qty 1

## 2016-03-18 MED ORDER — PRENATAL MULTIVITAMIN CH
1.0000 | ORAL_TABLET | Freq: Every day | ORAL | Status: DC
  Administered 2016-03-19: 1 via ORAL
  Filled 2016-03-18 (×3): qty 1

## 2016-03-18 MED ORDER — BENZOCAINE-MENTHOL 20-0.5 % EX AERO
1.0000 "application " | INHALATION_SPRAY | CUTANEOUS | Status: DC | PRN
  Administered 2016-03-18: 1 via TOPICAL
  Filled 2016-03-18: qty 56

## 2016-03-18 MED ORDER — ZOLPIDEM TARTRATE 5 MG PO TABS: 5.0000 mg | ORAL_TABLET | Freq: Every evening | ORAL | Status: DC | PRN

## 2016-03-18 MED ORDER — PROMETHAZINE HCL 25 MG/ML IJ SOLN
12.5000 mg | Freq: Four times a day (QID) | INTRAMUSCULAR | Status: DC | PRN
  Administered 2016-03-18: 12.5 mg via INTRAVENOUS
  Filled 2016-03-18: qty 1

## 2016-03-18 MED ORDER — SIMETHICONE 80 MG PO CHEW: 80.0000 mg | CHEWABLE_TABLET | ORAL | Status: DC | PRN

## 2016-03-18 MED ORDER — IBUPROFEN 600 MG PO TABS
600.0000 mg | ORAL_TABLET | Freq: Four times a day (QID) | ORAL | Status: DC
  Administered 2016-03-18 – 2016-03-19 (×4): 600 mg via ORAL
  Filled 2016-03-18 (×5): qty 1

## 2016-03-18 MED ORDER — WITCH HAZEL-GLYCERIN EX PADS: 1.0000 "application " | MEDICATED_PAD | CUTANEOUS | Status: DC | PRN

## 2016-03-18 MED ORDER — TETANUS-DIPHTH-ACELL PERTUSSIS 5-2.5-18.5 LF-MCG/0.5 IM SUSP
0.5000 mL | Freq: Once | INTRAMUSCULAR | Status: AC
  Administered 2016-03-19: 0.5 mL via INTRAMUSCULAR
  Filled 2016-03-18: qty 0.5

## 2016-03-18 MED ORDER — ONDANSETRON HCL 4 MG PO TABS: 4.0000 mg | ORAL_TABLET | ORAL | Status: DC | PRN

## 2016-03-18 MED ORDER — SENNOSIDES-DOCUSATE SODIUM 8.6-50 MG PO TABS
2.0000 | ORAL_TABLET | ORAL | Status: DC
  Administered 2016-03-18: 2 via ORAL
  Filled 2016-03-18: qty 2

## 2016-03-18 MED ORDER — OXYCODONE HCL 5 MG PO TABS
10.0000 mg | ORAL_TABLET | ORAL | Status: DC | PRN
Start: 2016-03-18 — End: 2016-03-19
  Administered 2016-03-19: 10 mg via ORAL
  Filled 2016-03-18: qty 2

## 2016-03-18 MED ORDER — DIPHENHYDRAMINE HCL 25 MG PO CAPS: 25.0000 mg | ORAL_CAPSULE | Freq: Four times a day (QID) | ORAL | Status: DC | PRN

## 2016-03-18 MED ORDER — PNEUMOCOCCAL VAC POLYVALENT 25 MCG/0.5ML IJ INJ
0.5000 mL | INJECTION | INTRAMUSCULAR | Status: AC
  Administered 2016-03-19: 0.5 mL via INTRAMUSCULAR
  Filled 2016-03-18: qty 0.5

## 2016-03-18 MED ORDER — COCONUT OIL OIL: 1.0000 "application " | TOPICAL_OIL | Status: DC | PRN

## 2016-03-18 MED ORDER — METHYLERGONOVINE MALEATE 0.2 MG/ML IJ SOLN
0.2000 mg | Freq: Once | INTRAMUSCULAR | Status: AC
  Administered 2016-03-18: 0.2 mg via INTRAMUSCULAR

## 2016-03-18 MED ORDER — ONDANSETRON HCL 4 MG/2ML IJ SOLN: 4.0000 mg | INTRAMUSCULAR | Status: DC | PRN

## 2016-03-18 MED ORDER — METHYLERGONOVINE MALEATE 0.2 MG/ML IJ SOLN
INTRAMUSCULAR | Status: AC
  Filled 2016-03-18: qty 1

## 2016-03-18 NOTE — Progress Notes (Signed)
UR chart review completed.  

## 2016-03-18 NOTE — Lactation Note (Signed)
This note was copied from a baby's chart. Lactation Consultation Note  Patient Name: Traci Spears ZOXWR'UToday's Date: 03/18/2016 Reason for consult: Initial assessment Assisted Mom with breast compression to help baby latch. Baby is tongue thrusting but once he gets good depth he demonstrates good suckling bursts. Mom has lots of colostrum with hand expression. Encouraged to continue to BF with feeding ques, basic teaching reviewed. Cluster feeding discussed. Lactation brochure left for review, advised of OP services and support group. Encouraged to call for questions/concerns or assist as needed.   Maternal Data Has patient been taught Hand Expression?: Yes Does the patient have breastfeeding experience prior to this delivery?: No  Feeding Feeding Type: Breast Fed  LATCH Score/Interventions Latch: Repeated attempts needed to sustain latch, nipple held in mouth throughout feeding, stimulation needed to elicit sucking reflex. Intervention(s): Adjust position;Assist with latch;Breast massage;Breast compression  Audible Swallowing: A few with stimulation  Type of Nipple: Everted at rest and after stimulation  Comfort (Breast/Nipple): Soft / non-tender     Hold (Positioning): Assistance needed to correctly position infant at breast and maintain latch. Intervention(s): Breastfeeding basics reviewed;Support Pillows;Position options;Skin to skin  LATCH Score: 7  Lactation Tools Discussed/Used WIC Program: Yes   Consult Status Consult Status: Follow-up Date: 03/18/16 Follow-up type: In-patient    Traci Spears, Joushua Dugar Ann 03/18/2016, 4:17 PM

## 2016-03-18 NOTE — Progress Notes (Signed)
LABOR PROGRESS NOTE  Traci Spears is a 20 y.o. G2P0010 at 563w0d  admitted for SOL  Subjective: Doing well  Objective: BP 116/71   Pulse 80   Temp 99.4 F (37.4 C) (Oral)   Resp 16   Ht 5\' 1"  (1.549 m)   Wt 75.8 kg (167 lb)   LMP 07/25/2015   SpO2 100%   BMI 31.55 kg/m  or  Vitals:   03/18/16 0102 03/18/16 0201 03/18/16 0301 03/18/16 0345  BP:      Pulse:      Resp: 16 16 16    Temp:  98.6 F (37 C)  99.4 F (37.4 C)  TempSrc:  Oral  Oral  SpO2:      Weight:      Height:         Dilation: 10 Dilation Complete Date: 03/18/16 Dilation Complete Time: 0219 Effacement (%): 100 Cervical Position: Middle Station: +1 Presentation: Vertex Exam by:: Auriel RN  FHT: 135 baseline, moderate variability, + accelerations, no decelerations Uterine activity: q2-645min  Labs: Lab Results  Component Value Date   WBC 8.9 03/17/2016   HGB 11.1 (L) 03/17/2016   HCT 32.6 (L) 03/17/2016   MCV 85.3 03/17/2016   PLT 213 03/17/2016    Patient Active Problem List   Diagnosis Date Noted  . Active labor at term 03/17/2016    Assessment / Plan: 20 y.o. G2P0010 at 1063w0d here for SOL  Labor: now AROM'd to clear, expectant management Fetal Wellbeing:  Category I Pain Control:  epidural Anticipated MOD:  SVD  Leland HerElsia J Danese Dorsainvil, DO PGY-1 9/4/20173:53 AM

## 2016-03-18 NOTE — Anesthesia Postprocedure Evaluation (Signed)
Anesthesia Post Note  Patient: Traci Spears  Procedure(s) Performed: * No procedures listed *  Patient location during evaluation: Mother Baby Anesthesia Type: Epidural Level of consciousness: awake, awake and alert, oriented and patient cooperative Pain management: pain level controlled Vital Signs Assessment: post-procedure vital signs reviewed and stable Respiratory status: spontaneous breathing, nonlabored ventilation and respiratory function stable Cardiovascular status: stable Postop Assessment: no headache, no backache, patient able to bend at knees and no signs of nausea or vomiting Anesthetic complications: no     Last Vitals:  Vitals:   03/18/16 0730 03/18/16 1200  BP: 115/69 115/68  Pulse: 69 75  Resp: 18 20  Temp: 37.6 C 37.2 C    Last Pain:  Vitals:   03/18/16 1200  TempSrc: Oral  PainSc: 0-No pain   Pain Goal: Patients Stated Pain Goal: 3 (03/17/16 1538)               Avis Mcmahill L

## 2016-03-19 ENCOUNTER — Ambulatory Visit: Payer: Self-pay

## 2016-03-19 MED ORDER — IBUPROFEN 600 MG PO TABS: 600.0000 mg | ORAL_TABLET | Freq: Four times a day (QID) | ORAL | 0 refills | Status: DC

## 2016-03-19 NOTE — Discharge Instructions (Signed)

## 2016-03-19 NOTE — Lactation Note (Signed)
This note was copied from a baby's chart. Lactation Consultation Note  Patient Name: Boy Gilman Buttneriquea Bisping ZOXWR'UToday's Date: 03/19/2016 Reason for consult: Follow-up assessment Mom reports baby has been self attaching with breastfeeding, she feels he is latching well but LC notes baby was shallow at breast this feeding. Assisted Mom with positioning to obtain more depth with latch. Mom has lots of colostrum with hand expression. Some audible swallows noted after baby obtained better depth at breast. Baby also using pacifier. Advised Mom of risk of early pacifier use to BF success. Advised baby should be at the breast 8-12 times in 24 hours and with feeding ques. Keep baby nursing for 15-20 minutes both breasts when possible. Monitor voids/stools. Only 1 void charted but Mom reports baby has had 2 voids since birth. Listen for swallows, call for assist as needed with latch.   Maternal Data    Feeding Feeding Type: Breast Fed Length of feed: 10 min  LATCH Score/Interventions Latch: Grasps breast easily, tongue down, lips flanged, rhythmical sucking. Intervention(s): Adjust position;Assist with latch;Breast massage;Breast compression  Audible Swallowing: A few with stimulation  Type of Nipple: Everted at rest and after stimulation (short nipple shafts)  Comfort (Breast/Nipple): Soft / non-tender     Hold (Positioning): Assistance needed to correctly position infant at breast and maintain latch. Intervention(s): Breastfeeding basics reviewed;Support Pillows;Position options;Skin to skin  LATCH Score: 8  Lactation Tools Discussed/Used     Consult Status Consult Status: Follow-up Date: 03/20/16 Follow-up type: In-patient    Alfred LevinsGranger, Katrena Stehlin Ann 03/19/2016, 3:44 PM

## 2016-03-19 NOTE — Progress Notes (Signed)
Post Partum Day 1 Subjective: Traci Spears is a 55QI [age] G2P1011 s/p SVD.  She is PPD #1 and other than being tired this morning had no major complaints.  She denies nausea, vomiting, dysuria. She endorses some abdominal cramping and pain which she rates at a 6/10 intensity.  Has not had a bowel movement yet but endorses flatus.  Reports minimal bleeding which has decreased since delivery.  She is breastfeeding and desires to continue with this. Plans on Nexplanon for birth control. voiding, tolerating PO and + flatus   Objective: Blood pressure 120/66, pulse 84, temperature 98.3 F (36.8 C), temperature source Oral, resp. rate 18, height 5\' 1"  (1.549 m), weight 75.8 kg (167 lb), last menstrual period 07/25/2015, SpO2 100 %, unknown if currently breastfeeding.  Physical Exam:  General: alert and no distress Lochia: appropriate Abdominal: active bowel sounds in all quadrants.  Mild suprapubic tenderness with deep palpation Uterine Fundus: firm DVT Evaluation: No evidence of DVT seen on physical exam.   Recent Labs  03/17/16 1738  HGB 11.1*  HCT 32.6*    Assessment/Plan: Continue with current management. Contraception :wants nexplanon   LOS: 2 days   Arlice ColtRichard Jaquavious Mercer 03/19/2016, 7:33 AM

## 2016-03-19 NOTE — Discharge Summary (Signed)
OB Discharge Summary     Patient Name: Traci Spears DOB: 02-07-1996 MRN: [id number]  Date of admission: 03/17/2016 Delivering MD: Leland HerYOO, ELSIA J   Date of discharge: 03/19/2016  Admitting diagnosis: 38 WKS, LABOR Intrauterine pregnancy: 5930w0d     Secondary diagnosis:  Active Problems:   Active labor at term  Additional problems: None     Discharge diagnosis: Term Pregnancy Delivered                                                                                                Post partum procedures:None  Augmentation: AROM  Complications: None  Hospital course:  Onset of Labor With Vaginal Delivery     20 y.o. yo G2P1011 at 1930w0d was admitted in Active Labor on 03/17/2016. Patient had an uncomplicated labor course as follows:  Membrane Rupture Time/Date: 3:45 AM ,03/18/2016   Intrapartum Procedures: Episiotomy: None [1]                                         Lacerations:  1st degree [2];Labial [10]  Patient had a delivery of a Viable infant. 03/18/2016  Information for the patient's newborn:  Karrie DoffingKing, Boy Oliviagrace [[id number]][[id number]]  Delivery Method: Vag-Spont    Pateint had an uncomplicated postpartum course.  She is ambulating, tolerating a regular diet, passing flatus, and urinating well. Patient is discharged home in stable condition on 03/19/16.    Physical exam Vitals:   03/18/16 1759 03/18/16 2100 03/19/16 0620 03/19/16 0820  BP: 121/77 119/69 120/66 110/77  Pulse: 90 89 84 77  Resp: 18 18 18 18   Temp: 98.4 F (36.9 C) 98.1 F (36.7 C) 98.3 F (36.8 C) 98.3 F (36.8 C)  TempSrc: Oral Oral Oral Oral  SpO2: 100%   100%  Weight:      Height:       General: alert, cooperative and no distress Lochia: appropriate Uterine Fundus: firm Incision: N/A DVT Evaluation: Negative Homan's sign. Labs: Lab Results  Component Value Date   WBC 8.9 03/17/2016   HGB 11.1 (L) 03/17/2016   HCT 32.6 (L) 03/17/2016   MCV 85.3 03/17/2016   PLT 213 03/17/2016   CMP Latest Ref Rng & Units  05/18/2015  Glucose 65 - 99 mg/dL 84  BUN 6 - 20 mg/dL 16  Creatinine 7.820.44 - 9.561.00 mg/dL 2.130.93  Sodium 086135 - 578145 mmol/L 137  Potassium 3.5 - 5.1 mmol/L 4.0  Chloride 101 - 111 mmol/L 107  CO2 22 - 32 mmol/L 22  Calcium 8.9 - 10.3 mg/dL 9.2  Total Protein 6.5 - 8.1 g/dL 6.4(L)  Total Bilirubin 0.3 - 1.2 mg/dL 0.4  Alkaline Phos 38 - 126 U/L 48  AST 15 - 41 U/L 15  ALT 14 - 54 U/L 8(L)    Discharge instruction: per After Visit Summary and "Baby and Me Booklet".  After visit meds:    Medication List    STOP taking these medications   cyclobenzaprine 5 MG tablet Commonly known as:  FLEXERIL  TAKE these medications   ibuprofen 600 MG tablet Commonly known as:  ADVIL,MOTRIN Take 1 tablet (600 mg total) by mouth every 6 (six) hours.   multivitamin-prenatal 27-0.8 MG Tabs tablet Take 1 tablet by mouth daily at 12 noon.   promethazine 25 MG tablet Commonly known as:  PHENERGAN Take 1 tablet (25 mg total) by mouth every 6 (six) hours as needed for nausea or vomiting.       Diet: routine diet  Activity: Advance as tolerated. Pelvic rest for 6 weeks.   Outpatient follow up:6 weeks Follow up Appt:No future appointments. Follow up Visit:No Follow-up on file.  Postpartum contraception: Nexplanon  Newborn Data: Live born female  Birth Weight: 6 lb 9.1 oz (2980 g) APGAR: 9, 9  Baby Feeding: Breast Disposition:home with mother   03/19/2016 Dorathy Kinsman, CNM

## 2016-03-20 ENCOUNTER — Ambulatory Visit: Payer: Self-pay

## 2016-03-20 NOTE — Lactation Note (Signed)
This note was copied from a baby's chart. Lactation Consultation Note  Patient Name: Boy Gilman Buttneriquea Mota NWGNF'AToday's Date: 03/20/2016 Reason for consult: Follow-up assessment Mom trying to latch baby in cradle hold but baby not obtaining good depth. Mom has very short nipples shafts, almost flat but compressible. Using breast compression and good positioning, baby can obtain good depth and demonstrate good suckling bursts with swallows noted. Mom's breast are filling, left breast softened with baby nursing. Stressed importance of baby having good depth to milk transfer and preventing nipple soreness. Mom reports mild nipple tenderness, no breakdown noted. Advised to apply EBM and ask RN for coconut oil to use prn. Advised baby should be at breast 8-12 times in 24 hours and with feeding ques. Breast should be softening with baby nursing and good massage with nursing. Engorgement care reviewed, ice packs given for comfort.  Advised to help with latch to hand express or pre-pump off some milk so baby can obtain more depth. Post pump as needed for comfort and give baby back any amount of EBM she receives with hand expression or pumping.  Advised of OP services and support group. Call for questions/cocerns.   Maternal Data    Feeding Feeding Type: Breast Fed Length of feed: 15 min  LATCH Score/Interventions Latch: Repeated attempts needed to sustain latch, nipple held in mouth throughout feeding, stimulation needed to elicit sucking reflex. Intervention(s): Adjust position;Assist with latch;Breast massage;Breast compression  Audible Swallowing: Spontaneous and intermittent Intervention(s): Skin to skin;Hand expression  Type of Nipple: Everted at rest and after stimulation (short nipple shafts bilateral)  Comfort (Breast/Nipple): Filling, red/small blisters or bruises, mild/mod discomfort  Problem noted: Filling;Mild/Moderate discomfort Interventions (Mild/moderate discomfort): Pre-pump if  needed;Post-pump (EBM to sore nipples)  Hold (Positioning): Assistance needed to correctly position infant at breast and maintain latch. Intervention(s): Breastfeeding basics reviewed;Support Pillows;Position options;Skin to skin  LATCH Score: 7  Lactation Tools Discussed/Used Tools: Pump Breast pump type: Double-Electric Breast Pump   Consult Status Consult Status: Complete Date: 03/20/16 Follow-up type: In-patient    Alfred LevinsGranger, Mae Cianci Ann 03/20/2016, 11:18 AM

## 2016-07-29 ENCOUNTER — Ambulatory Visit (HOSPITAL_COMMUNITY)
Admission: EM | Admit: 2016-07-29 | Discharge: 2016-07-29 | Disposition: A | Discharge status: Home / Self Care | Payer: Medicaid Other | Attending: Family Medicine | Admitting: Family Medicine

## 2016-07-29 ENCOUNTER — Encounter (HOSPITAL_COMMUNITY): Payer: Self-pay | Admitting: Emergency Medicine

## 2016-07-29 DIAGNOSIS — L0231 Cutaneous abscess of buttock: Secondary | ICD-10-CM | POA: Diagnosis not present

## 2016-07-29 MED ORDER — LIDOCAINE-EPINEPHRINE (PF) 2 %-1:200000 IJ SOLN
INTRAMUSCULAR | Status: AC
  Filled 2016-07-29: qty 20

## 2016-07-29 MED ORDER — CLINDAMYCIN HCL 300 MG PO CAPS: 300.0000 mg | ORAL_CAPSULE | Freq: Three times a day (TID) | ORAL | 0 refills | Status: DC

## 2016-07-29 NOTE — Discharge Instructions (Signed)
°  The antibiotic you will be on has shown to be excreted in breast milk, however, this antibiotic is necessary for the type of infection that you have.  You may space out your feedings, or keep using sitz baths/warm soaks and see if the infection resolves on its own now that the abscess has been opened.  If you have questions about the medication, please speak with your pharmacist or your OB/GYN for further information.

## 2016-07-29 NOTE — ED Triage Notes (Signed)
[facility] a [facility] abscess on her left buttock x 3 days.

## 2016-07-29 NOTE — ED Provider Notes (Signed)
CSN: [id number]     Arrival date & time 07/29/16  1426 History   First MD Initiated Contact with Patient 07/29/16 1705     Chief Complaint  Patient presents with  . Abscess   (Consider location/radiation/quality/duration/timing/severity/associated sxs/prior Treatment) HPI Traci Spears is a 21 y.o. female presenting to UC with c/o 3 days of gradually worsening pain Left buttock c/w prior abscesses in same spot.  Pain is aching and sore, 3/10.  She has tried witch hazel and warm compresses with mild temporary relief. She has had similar abscess about 4 times, always around this time of year. Denies fever, chills, n/v/d. Pt notes she is currently breastfeeding her 72mo old.    Past Medical History:  Diagnosis Date  . Anemia   . Asthma   . Headache   . Miscarriage    Past Surgical History:  Procedure Laterality Date  . DILATION AND CURETTAGE OF UTERUS    . miscarriage     History reviewed. No pertinent family history. Social History  Substance Use Topics  . Smoking status: Never Smoker  . Smokeless tobacco: Never Used  . Alcohol use No   OB History    Gravida Para Term Preterm AB Living   2 1 1   1 1    SAB TAB Ectopic Multiple Live Births   1     0 1     Review of Systems  Constitutional: Negative for chills and fever.  Gastrointestinal: Negative for abdominal pain, diarrhea, nausea and vomiting.  Skin: Positive for wound.       Buttock abscess    Allergies  Zofran [ondansetron]  Home Medications   Prior to Admission medications   Medication Sig Start Date End Date Taking? Authorizing Provider  Prenatal Vit-Fe Fumarate-FA (MULTIVITAMIN-PRENATAL) 27-0.8 MG TABS tablet Take 1 tablet by mouth daily at 12 noon.   Yes Historical Provider, MD  clindamycin (CLEOCIN) 300 MG capsule Take 1 capsule (300 mg total) by mouth 3 (three) times daily. X 7 days 07/29/16   Junius FinnerErin O'Malley, PA-C   Meds Ordered and Administered this Visit  Medications - No data to display  BP 120/76 (BP  Location: Right Arm)   Pulse 115   Temp 100 F (37.8 C) (Oral)   Resp 18   LMP 05/15/2016   SpO2 100%   Breastfeeding? Yes  No data found.   Physical Exam  Constitutional: She is oriented to person, place, and time. She appears well-developed and well-nourished.  HENT:  Head: Normocephalic and atraumatic.  Eyes: EOM are normal.  Neck: Normal range of motion.  Cardiovascular: Normal rate.   Pulmonary/Chest: Effort normal.  Genitourinary:  Genitourinary Comments: Left buttock along gluteal cleft- 4mm area of induration with centralized fluctuance and scant yellow discharge with red blood.  Musculoskeletal: Normal range of motion.  Neurological: She is alert and oriented to person, place, and time.  Skin: Skin is warm and dry.  Psychiatric: She has a normal mood and affect. Her behavior is normal.  Nursing note and vitals reviewed.   Urgent Care Course   Clinical Course     .Marland Kitchen.Incision and Drainage Date/Time: 07/29/2016 6:01 PM Performed by: Junius Finner'MALLEY, Winfrey Chillemi Authorized by: Bradd CanaryKINDL, JAMES D   Consent:    Consent obtained:  Verbal   Consent given by:  Patient   Risks discussed:  Bleeding, incomplete drainage, infection and pain   Alternatives discussed:  Alternative treatment and observation (antibiotics only) Location:    Type:  Abscess   Size:  4mm  Location:  Anogenital   Anogenital location:  Gluteal cleft Pre-procedure details:    Skin preparation:  Antiseptic wash Anesthesia (see MAR for exact dosages):    Anesthesia method:  Local infiltration   Local anesthetic:  Lidocaine 2% WITH epi Procedure type:    Complexity:  Simple Procedure details:    Incision types:  Single straight   Incision depth:  Dermal   Scalpel blade:  11   Wound management:  Probed and deloculated   Drainage:  Bloody and purulent   Drainage amount:  Moderate   Wound treatment:  Wound left open   Packing materials:  None Post-procedure details:    Patient tolerance of procedure:   Tolerated well, no immediate complications   (including critical care time)  Labs Review Labs Reviewed - No data to display  Imaging Review No results found.    MDM   1. Abscess of buttock, left    Abscess of Left buttock along gluteal cleft. I&D successfully performed. Pt is breastfeeding a 99mo old. Consulted with Dr. Artis Flock, Clindamycin safest choice for likely MRSA infection Encouraged f/u with PCP or return to UC if needed for wound recheck. Resource info for [facility] provided due to recurrence of abscess. Patient verbalized understanding and agreement with treatment plan.     Junius Finner, PA-C 07/29/16 1804    Junius Finner, PA-C 07/29/16 1804    Junius Finner, PA-C 07/29/16 [phone]

## 2016-08-05 ENCOUNTER — Ambulatory Visit (HOSPITAL_COMMUNITY)
Admission: EM | Admit: 2016-08-05 | Discharge: 2016-08-05 | Disposition: A | Discharge status: Home / Self Care | Payer: Medicaid Other | Attending: Family Medicine | Admitting: Family Medicine

## 2016-08-05 ENCOUNTER — Encounter (HOSPITAL_COMMUNITY): Payer: Self-pay | Admitting: Family Medicine

## 2016-08-05 DIAGNOSIS — R509 Fever, unspecified: Secondary | ICD-10-CM | POA: Insufficient documentation

## 2016-08-05 DIAGNOSIS — J029 Acute pharyngitis, unspecified: Secondary | ICD-10-CM | POA: Diagnosis present

## 2016-08-05 LAB — POCT RAPID STREP A: Streptococcus, Group A Screen (Direct): NEGATIVE

## 2016-08-05 MED ORDER — IPRATROPIUM BROMIDE 0.06 % NA SOLN: 2.0000 | Freq: Four times a day (QID) | NASAL | 1 refills | Status: DC

## 2016-08-05 MED ORDER — AZITHROMYCIN 250 MG PO TABS: ORAL_TABLET | ORAL | 0 refills | Status: DC

## 2016-08-05 NOTE — ED Provider Notes (Signed)
[facility]    CSN: [id number] Arrival date & time: 08/05/16  1609     History   Chief Complaint Chief Complaint  Patient presents with  . Sore Throat    HPI Traci Spears is a 21 y.o. female.   The history is provided by the patient.  Sore Throat  This is a new problem. The current episode started yesterday. The problem has not changed since onset.The symptoms are aggravated by swallowing.    Past Medical History:  Diagnosis Date  . Anemia   . Asthma   . Headache   . Miscarriage     Patient Active Problem List   Diagnosis Date Noted  . Active labor at term 03/17/2016    Past Surgical History:  Procedure Laterality Date  . DILATION AND CURETTAGE OF UTERUS    . miscarriage      OB History    Gravida Para Term Preterm AB Living   2 1 1   1 1    SAB TAB Ectopic Multiple Live Births   1     0 1       Home Medications    Prior to Admission medications   Medication Sig Start Date End Date Taking? Authorizing Provider  azithromycin (ZITHROMAX Z-PAK) 250 MG tablet Take as directed on pack 08/05/16   Linna Hoff, MD  clindamycin (CLEOCIN) 300 MG capsule Take 1 capsule (300 mg total) by mouth 3 (three) times daily. X 7 days 07/29/16   Junius Finner, PA-C  ipratropium (ATROVENT) 0.06 % nasal spray Place 2 sprays into both nostrils 4 (four) times daily. 08/05/16   Linna Hoff, MD  Prenatal Vit-Fe Fumarate-FA (MULTIVITAMIN-PRENATAL) 27-0.8 MG TABS tablet Take 1 tablet by mouth daily at 12 noon.    Historical Provider, MD    Family History History reviewed. No pertinent family history.  Social History Social History  Substance Use Topics  . Smoking status: Never Smoker  . Smokeless tobacco: Never Used  . Alcohol use No     Allergies   Zofran [ondansetron]   Review of Systems Review of Systems  Constitutional: Negative.   HENT: Positive for congestion, postnasal drip, rhinorrhea and sore throat.   Respiratory: Negative.   All other systems  reviewed and are negative.    Physical Exam Triage Vital Signs ED Triage Vitals  Enc Vitals Group     BP 08/05/16 1659 114/76     Pulse Rate 08/05/16 1659 78     Resp 08/05/16 1659 18     Temp 08/05/16 1659 97.8 F (36.6 C)     Temp src --      SpO2 08/05/16 1659 100 %     Weight --      Height --      Head Circumference --      Peak Flow --      Pain Score 08/05/16 1700 6     Pain Loc --      Pain Edu? --      Excl. in GC? --    No data found.   Updated Vital Signs BP 114/76   Pulse 78   Temp 97.8 F (36.6 C)   Resp 18   SpO2 100%   Visual Acuity Right Eye Distance:   Left Eye Distance:   Bilateral Distance:    Right Eye Near:   Left Eye Near:    Bilateral Near:     Physical Exam  Constitutional: She is oriented to person, place,  and time. She appears well-developed and well-nourished. No distress.  HENT:  Right Ear: External ear normal.  Left Ear: External ear normal.  Nose: Nose normal.  Mouth/Throat: Oropharynx is clear and moist.  Eyes: Pupils are equal, round, and reactive to light.  Neck: Normal range of motion. Neck supple.  Cardiovascular: Normal rate and regular rhythm.   Lymphadenopathy:    She has no cervical adenopathy.  Neurological: She is alert and oriented to person, place, and time.  Skin: Skin is warm and dry.  Nursing note and vitals reviewed.    UC Treatments / Results  Labs (all labs ordered are listed, but only abnormal results are displayed) Labs Reviewed  CULTURE, GROUP A STREP [facility]  POCT RAPID STREP A   Strep neg  EKG  EKG Interpretation None       Radiology No results found.  Procedures Procedures (including critical care time)  Medications Ordered in UC Medications - No data to display   Initial Impression / Assessment and Plan / UC Course  I have reviewed the triage vital signs and the nursing notes.  Pertinent labs & imaging results that were available during my care of the patient were reviewed  by me and considered in my medical decision making (see chart for details).       Final Clinical Impressions(s) / UC Diagnoses   Final diagnoses:  Acute pharyngitis, unspecified etiology    New Prescriptions Discharge Medication List as of 08/05/2016  5:34 PM    START taking these medications   Details  azithromycin (ZITHROMAX Z-PAK) 250 MG tablet Take as directed on pack, Normal    ipratropium (ATROVENT) 0.06 % nasal spray Place 2 sprays into both nostrils 4 (four) times daily., Starting Mon 08/05/2016, Normal         Linna HoffJames D Jaren Vanetten, MD 08/14/16 2112

## 2016-08-05 NOTE — Discharge Instructions (Signed)
Drink lots of fluids, take all of medicine, use lozenges as needed.return if needed °

## 2016-08-05 NOTE — ED Triage Notes (Signed)
Pt here for sore throat, fever

## 2016-08-06 LAB — CULTURE, GROUP A STREP (THRC)
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
## 2017-05-30 IMAGING — US US TRANSVAGINAL NON-OB
1 series · 13 of 25 positions shown · non-contrast
Comparison: None in PACs

CLINICAL DATA: Left lower quadrant and mid pelvic pain since 2 a.m.
today

EXAM:
TRANSABDOMINAL AND TRANSVAGINAL ULTRASOUND OF PELVIS
TECHNIQUE: Both transabdominal and transvaginal ultrasound examinations of the
pelvis were performed. Transabdominal technique was performed for
global imaging of the pelvis including uterus, ovaries, adnexal
regions, and pelvic cul-de-sac. It was necessary to proceed with
endovaginal exam following the transabdominal exam to visualize the
endometrium and ovaries..

[Series 1: us transvaginal non-ob · 0.20mm/px · 97 acquisitions, 13 frames shown]
[im 1/97]
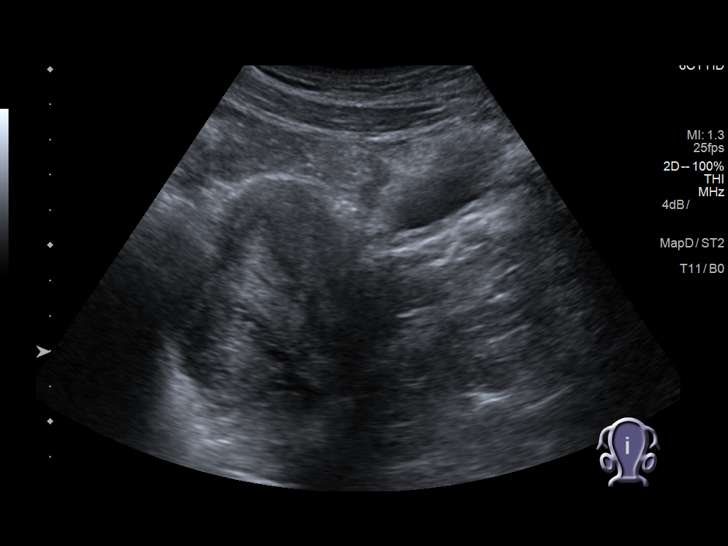
[im 9/97]
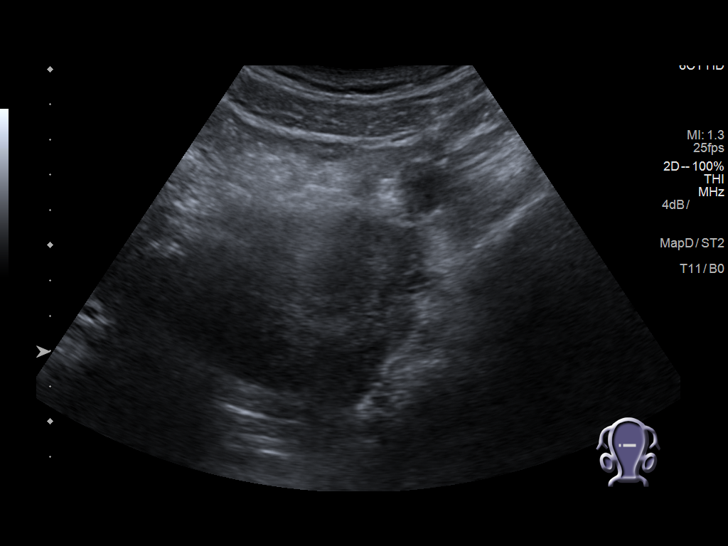
[im 17/97]
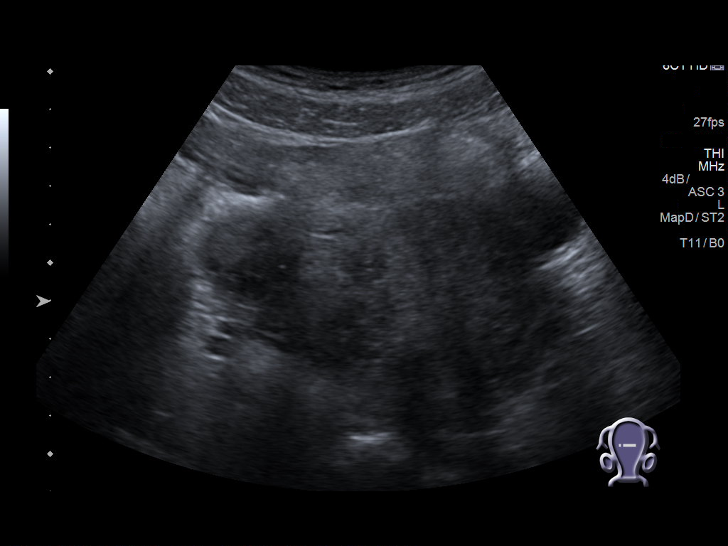
[im 25/97]
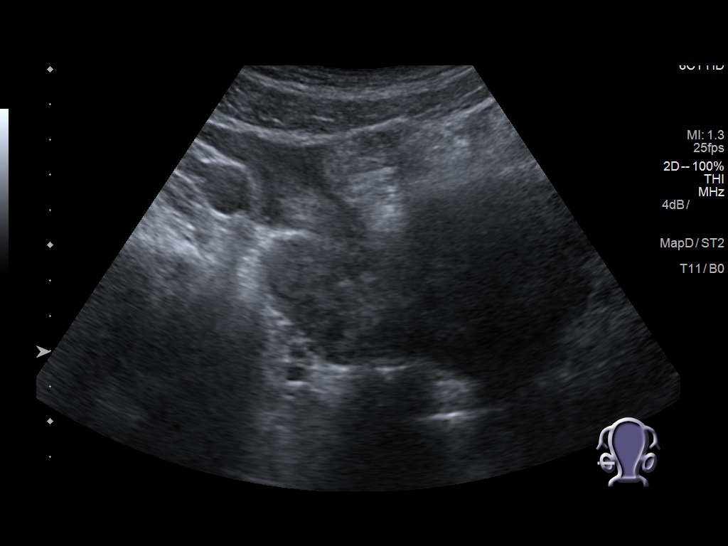
[im 33/97]
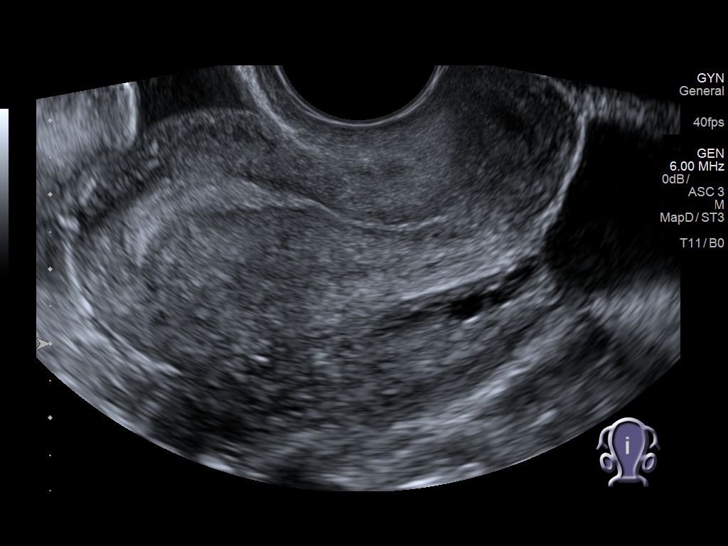
[im 41/97]
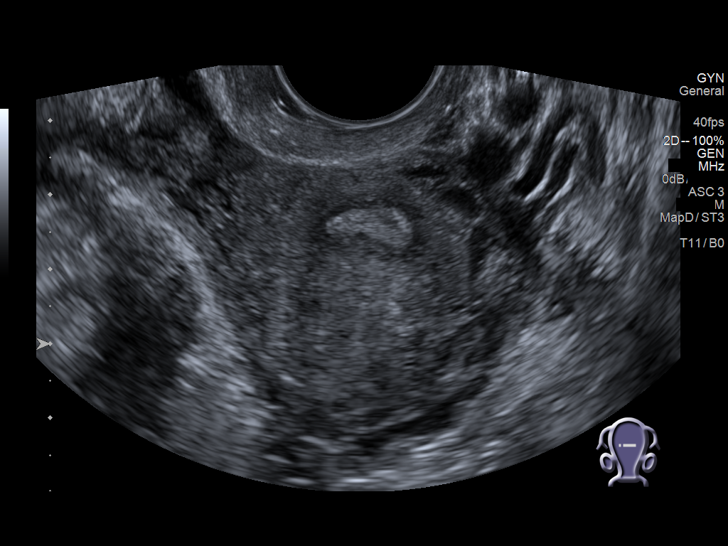
[im 49/97]
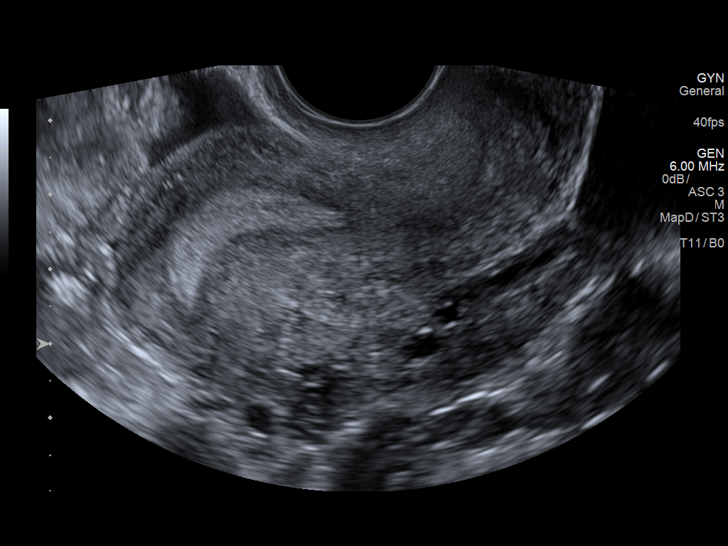
[im 57/97]
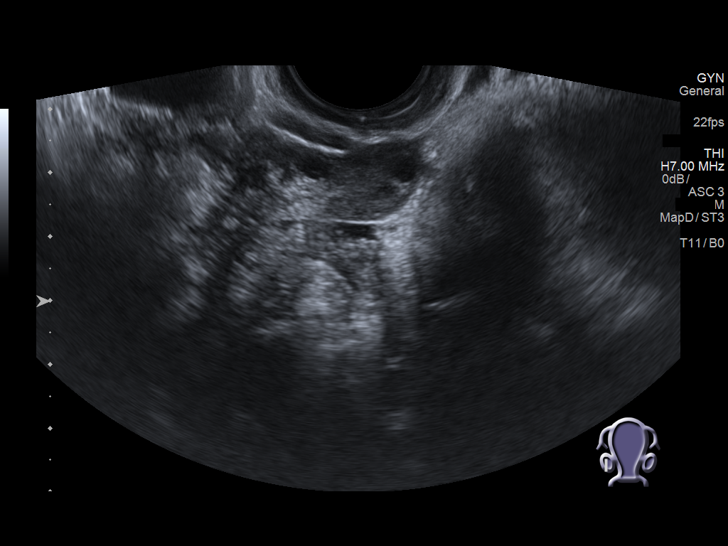
[im 65/97]
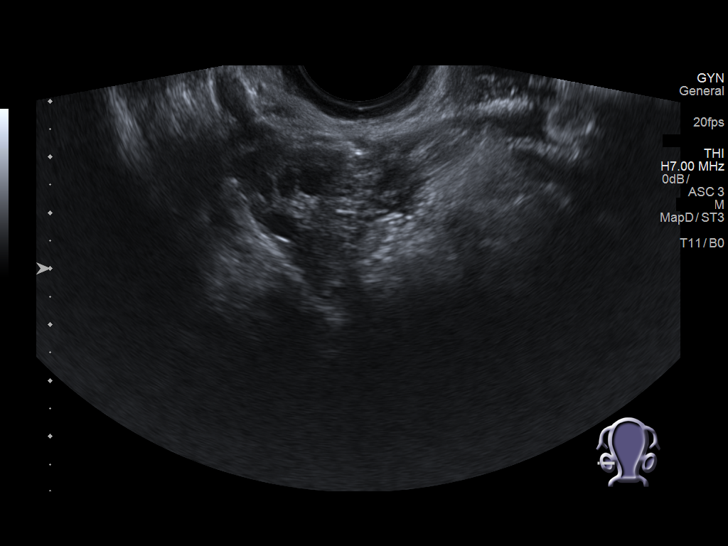
[im 73/97]
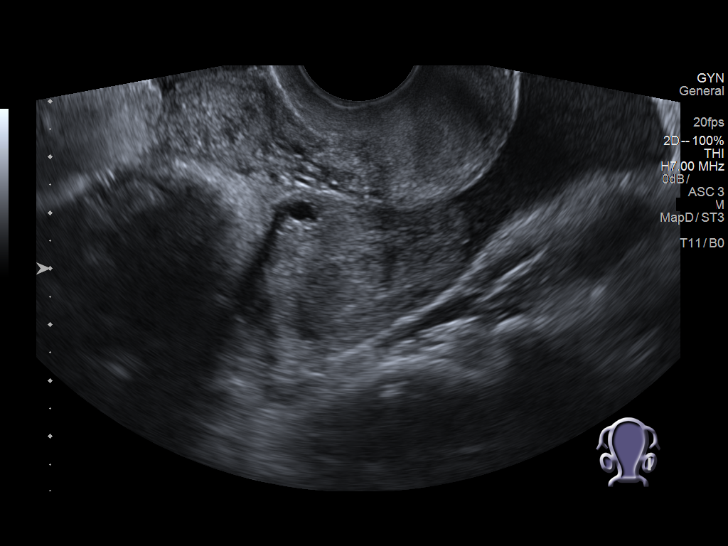
[im 81/97]
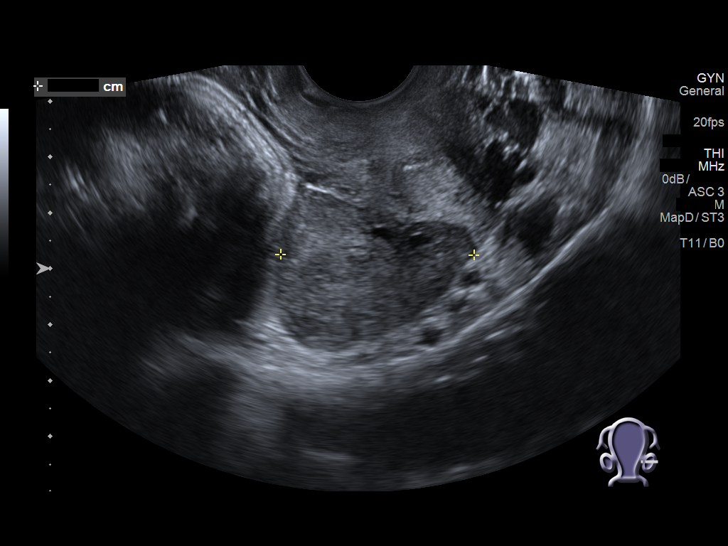
[im 89/97]
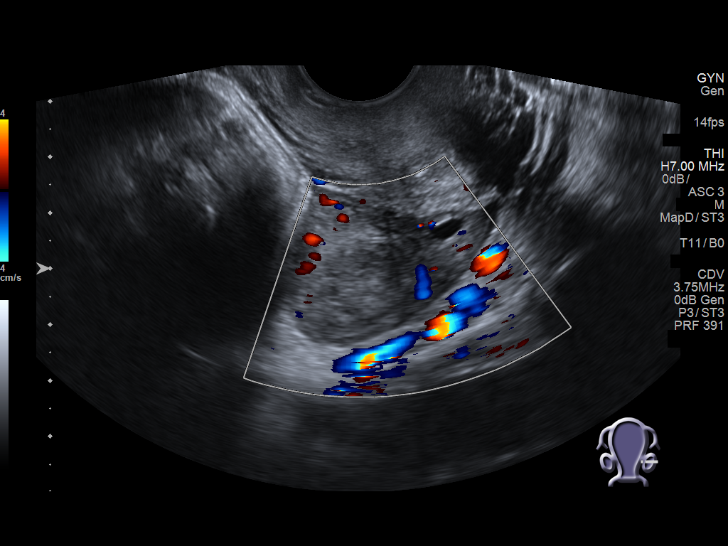
[im 97/97]
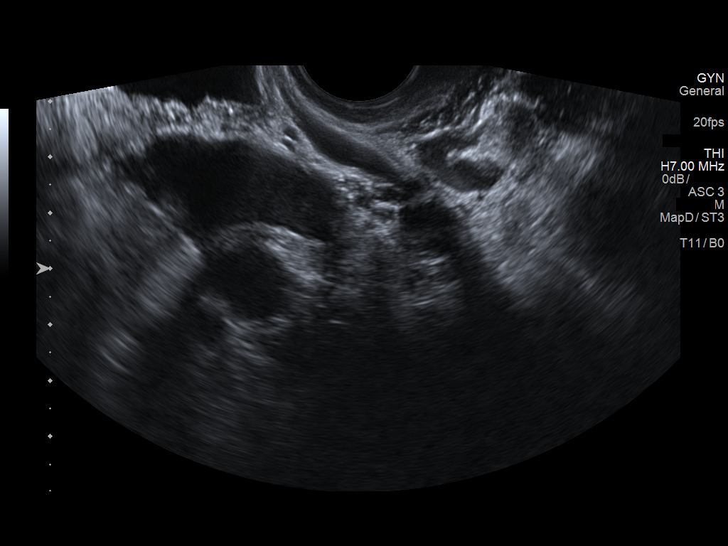

[13 of 25 positions shown; findings below may reference images not displayed]

FINDINGS: Uterus

Measurements: 7.0 x 3.7 x 4.2 cm. No fibroids or other mass
visualized.

Endometrium

Thickness: 6.6 mm.  No focal abnormality visualized.

Right ovary

Measurements: 3.5 x 2.3 x 2.6 cm. Normal appearance/no adnexal mass.

Left ovary

Measurements: 4.3 x 2.9 x 3.5 cm. There is a 3.2 x 2.0 x 2.3 cm
focus adjacent to the left ovary which exhibits mixed decreased
echogenicity. The structure is not hypervascular. There are
developing follicles in the surrounding ovarian tissue.

Other findings

There is a small amount of free pelvic fluid.
IMPRESSION: 1. Complex hypoechoic structure in the left ovary likely reflects a
hemorrhagic cyst measuring up to 3.2 cm. There is a small amount of
free pelvic fluid. Correlation with patient's beta HCG and white
blood cell count is recommended. If the patient's symptoms resolve,
no further imaging is indicated. If patient's symptoms persist,
follow-up ultrasound in 6-12 weeks is recommended.
2. The right ovary and the uterus are unremarkable.

## 2017-08-03 ENCOUNTER — Other Ambulatory Visit: Payer: Self-pay

## 2017-08-03 ENCOUNTER — Emergency Department (HOSPITAL_COMMUNITY)
Admission: EM | Admit: 2017-08-03 | Discharge: 2017-08-03 | Disposition: A | Discharge status: Home / Self Care | Payer: Self-pay | Attending: Emergency Medicine | Admitting: Emergency Medicine

## 2017-08-03 ENCOUNTER — Encounter (HOSPITAL_COMMUNITY): Payer: Self-pay | Admitting: *Deleted

## 2017-08-03 DIAGNOSIS — K0889 Other specified disorders of teeth and supporting structures: Secondary | ICD-10-CM | POA: Insufficient documentation

## 2017-08-03 DIAGNOSIS — J45909 Unspecified asthma, uncomplicated: Secondary | ICD-10-CM | POA: Insufficient documentation

## 2017-08-03 MED ORDER — TRAMADOL HCL 50 MG PO TABS: 50.0000 mg | ORAL_TABLET | Freq: Four times a day (QID) | ORAL | 0 refills | Status: DC | PRN

## 2017-08-03 MED ORDER — OXYCODONE-ACETAMINOPHEN 5-325 MG PO TABS
1.0000 | ORAL_TABLET | Freq: Once | ORAL | Status: AC
  Administered 2017-08-03: 1 via ORAL
  Filled 2017-08-03: qty 1

## 2017-08-03 MED ORDER — PENICILLIN V POTASSIUM 500 MG PO TABS: 500.0000 mg | ORAL_TABLET | Freq: Four times a day (QID) | ORAL | 0 refills | Status: AC

## 2017-08-03 NOTE — Discharge Instructions (Signed)
Please read attached information. If you experience any new or worsening signs or symptoms please return to the emergency room for evaluation. Please follow-up with your primary care provider or specialist as discussed. Please use medication prescribed only as directed and discontinue taking if you have any concerning signs or symptoms.   °

## 2017-08-03 NOTE — ED Notes (Signed)
Pt. States she understands instructions. home stable with steady gait.

## 2017-08-03 NOTE — ED Provider Notes (Signed)
[facility] Note   CSN: [id number] Arrival date & time: 08/03/17  0502     History   Chief Complaint Chief Complaint  Patient presents with  . Dental Pain    HPI Gilman Buttneriquea Gatley is a 22 y.o. female.  HPI   22 year old female presents today with complaints of dental pain.  Patient reports several months of off-and-on right upper dental pain.  She notes last night symptoms worsened with severe pain.  She took ibuprofen without improvement in her symptoms.  Patient denies any infectious etiology.  Patient reports that she has had significant dental procedures, but does not currently have a dentist that she does not have insurance.  Past Medical History:  Diagnosis Date  . Anemia   . Asthma   . Headache   . Miscarriage     Patient Active Problem List   Diagnosis Date Noted  . Active labor at term 03/17/2016    Past Surgical History:  Procedure Laterality Date  . DILATION AND CURETTAGE OF UTERUS    . miscarriage      OB History    Gravida Para Term Preterm AB Living   2 1 1   1 1    SAB TAB Ectopic Multiple Live Births   1     0 1       Home Medications    Prior to Admission medications   Medication Sig Start Date End Date Taking? Authorizing Provider  azithromycin (ZITHROMAX Z-PAK) 250 MG tablet Take as directed on pack 08/05/16   Linna HoffKindl, James D, MD  clindamycin (CLEOCIN) 300 MG capsule Take 1 capsule (300 mg total) by mouth 3 (three) times daily. X 7 days 07/29/16   Lurene ShadowPhelps, Erin O, PA-C  ipratropium (ATROVENT) 0.06 % nasal spray Place 2 sprays into both nostrils 4 (four) times daily. 08/05/16   Linna HoffKindl, James D, MD  penicillin v potassium (VEETID) 500 MG tablet Take 1 tablet (500 mg total) by mouth 4 (four) times daily for 10 days. 08/03/17 08/13/17  Windle Huebert, Tinnie GensJeffrey, PA-C  Prenatal Vit-Fe Fumarate-FA (MULTIVITAMIN-PRENATAL) 27-0.8 MG TABS tablet Take 1 tablet by mouth daily at 12 noon.    [provider]  traMADol  (ULTRAM) 50 MG tablet Take 1 tablet (50 mg total) by mouth every 6 (six) hours as needed. 08/03/17   Eyvonne MechanicHedges, Batya Citron, PA-C    Family History No family history on file.  Social History Social History   Tobacco Use  . Smoking status: Never Smoker  . Smokeless tobacco: Never Used  Substance Use Topics  . Alcohol use: No  . Drug use: No     Allergies   Zofran [ondansetron]   Review of Systems Review of Systems  All other systems reviewed and are negative.    Physical Exam Updated Vital Signs BP 110/72 (BP Location: Right Arm)   Pulse 65   Temp 98.2 F (36.8 C) (Oral)   Resp 16   LMP 07/20/2017   SpO2 100%   Physical Exam  Constitutional: She is oriented to person, place, and time. She appears well-developed and well-nourished.  HENT:  Head: Normocephalic and atraumatic.  Hardware noted on the upper dentition and roots of the mouth-gumline palpated with no swelling or fluctuance-face is symmetrical-neck is supple full active range of motion  Eyes: Conjunctivae are normal. Pupils are equal, round, and reactive to light. Right eye exhibits no discharge. Left eye exhibits no discharge. No scleral icterus.  Neck: Normal range of motion. No JVD present.  No tracheal deviation present.  Pulmonary/Chest: Effort normal. No stridor.  Neurological: She is alert and oriented to person, place, and time. Coordination normal.  Psychiatric: She has a normal mood and affect. Her behavior is normal. Judgment and thought content normal.  Nursing note and vitals reviewed.    ED Treatments / Results  Labs (all labs ordered are listed, but only abnormal results are displayed) Labs Reviewed - No data to display  EKG  EKG Interpretation None       Radiology No results found.  Procedures Procedures (including critical care time)  Medications Ordered in ED Medications  oxyCODONE-acetaminophen (PERCOCET/ROXICET) 5-325 MG per tablet 1 tablet (1 tablet Oral Given 08/03/17 1004)      Initial Impression / Assessment and Plan / ED Course  I have reviewed the triage vital signs and the nursing notes.  Pertinent labs & imaging results that were available during my care of the patient were reviewed by me and considered in my medical decision making (see chart for details).       Final Clinical Impressions(s) / ED Diagnoses   Final diagnoses:  Pain, dental   22 year old female presents today with complaints of dental pain.  Patient reports subjective swelling, no objective swelling on my exam.  Patient concern for infectious etiology.  She will be started on a course of antibiotics, encouraged follow-up with dental resources, she is given strict return precautions, she verbalized understanding and agreement to today's plan had no further questions or concerns at the time of discharge.  Patient reports she is not pregnant or breast-feeding.  ED Discharge Orders        Ordered    penicillin v potassium (VEETID) 500 MG tablet  4 times daily     08/03/17 1005    traMADol (ULTRAM) 50 MG tablet  Every 6 hours PRN     08/03/17 1005       Eyvonne Mechanic, PA-C 08/03/17 1005    Loren Racer, MD 08/03/17 1537

## 2017-08-03 NOTE — ED Triage Notes (Signed)
Pt /o right sided dental pain for a few hours. Has taken 320mg  of ibuprofen without relief

## 2018-01-23 ENCOUNTER — Encounter (HOSPITAL_COMMUNITY): Payer: Self-pay | Admitting: *Deleted

## 2018-01-23 ENCOUNTER — Other Ambulatory Visit: Payer: Self-pay

## 2018-01-23 ENCOUNTER — Emergency Department (HOSPITAL_COMMUNITY)
Admission: EM | Admit: 2018-01-23 | Discharge: 2018-01-23 | Disposition: A | Discharge status: Home / Self Care | Payer: Self-pay | Attending: Emergency Medicine | Admitting: Emergency Medicine

## 2018-01-23 DIAGNOSIS — J45909 Unspecified asthma, uncomplicated: Secondary | ICD-10-CM | POA: Insufficient documentation

## 2018-01-23 DIAGNOSIS — K0889 Other specified disorders of teeth and supporting structures: Secondary | ICD-10-CM | POA: Insufficient documentation

## 2018-01-23 MED ORDER — PENICILLIN V POTASSIUM 500 MG PO TABS: 500.0000 mg | ORAL_TABLET | Freq: Four times a day (QID) | ORAL | 0 refills | Status: AC

## 2018-01-23 MED ORDER — PENICILLIN V POTASSIUM 500 MG PO TABS
500.0000 mg | ORAL_TABLET | Freq: Once | ORAL | Status: AC
  Administered 2018-01-23: 500 mg via ORAL
  Filled 2018-01-23: qty 1

## 2018-01-23 MED ORDER — ACETAMINOPHEN 500 MG PO TABS: 500.0000 mg | ORAL_TABLET | Freq: Four times a day (QID) | ORAL | 0 refills | Status: AC | PRN

## 2018-01-23 MED ORDER — IBUPROFEN 800 MG PO TABS: 800.0000 mg | ORAL_TABLET | Freq: Three times a day (TID) | ORAL | 0 refills | Status: DC

## 2018-01-23 MED ORDER — BENZOCAINE 10 % MT GEL: 1.0000 "application " | OROMUCOSAL | 0 refills | Status: DC | PRN

## 2018-01-23 NOTE — Discharge Instructions (Addendum)
Medications: Penicillin, ibuprofen, Tylenol, benzocaine  Treatment: Take penicillin until completed.  Alternate ibuprofen and Tylenol as needed for your pain.  You can apply benzocaine as prescribed, as needed for your pain as well.  Follow-up: Please follow-up with the dental resources below.  Please return to emergency department if you develop any new or worsening symptoms.

## 2018-01-23 NOTE — ED Triage Notes (Signed)
Pt complains of right sided dental pain and facial swelling. Pt states she has cavity in right upper mouth.

## 2018-01-23 NOTE — ED Provider Notes (Signed)
[facility]-EMERGENCY DEPT Provider Note   CSN: [id number] Arrival date & time: 01/23/18  0909     History   Chief Complaint Chief Complaint  Patient presents with  . Dental Pain    HPI Traci Spears is a 22 y.o. female with history of asthma, anemia who presents with a 1 day history of R upper dental pain.  Patient reports has a known cavity in the tooth, but has never had pain like this.  She denies any fevers at home.  She has had some pain radiating to her neck.  She has been taking ibuprofen at home with minimal relief.  She does not have a dentist currently.  HPI  Past Medical History:  Diagnosis Date  . Anemia   . Asthma   . Headache   . Miscarriage     Patient Active Problem List   Diagnosis Date Noted  . Active labor at term 03/17/2016    Past Surgical History:  Procedure Laterality Date  . DILATION AND CURETTAGE OF UTERUS    . miscarriage       OB History    Gravida  2   Para  1   Term  1   Preterm      AB  1   Living  1     SAB  1   TAB      Ectopic      Multiple  0   Live Births  1            Home Medications    Prior to Admission medications   Medication Sig Start Date End Date Taking? Authorizing Provider  acetaminophen (TYLENOL) 500 MG tablet Take 1 tablet (500 mg total) by mouth every 6 (six) hours as needed. 01/23/18   Dannette Kinkaid, Waylan Boga, PA-C  azithromycin (ZITHROMAX Z-PAK) 250 MG tablet Take as directed on pack Patient not taking: Reported on 01/23/2018 08/05/16   Linna Hoff, MD  benzocaine (ORAJEL) 10 % mucosal gel Use as directed 1 application in the mouth or throat as needed for mouth pain. 01/23/18   Cache Bills, Waylan Boga, PA-C  clindamycin (CLEOCIN) 300 MG capsule Take 1 capsule (300 mg total) by mouth 3 (three) times daily. X 7 days Patient not taking: Reported on 01/23/2018 07/29/16   Lurene Shadow, PA-C  ibuprofen (ADVIL,MOTRIN) 800 MG tablet Take 1 tablet (800 mg total) by mouth 3 (three) times  daily. 01/23/18   Philippa Vessey, Waylan Boga, PA-C  ipratropium (ATROVENT) 0.06 % nasal spray Place 2 sprays into both nostrils 4 (four) times daily. Patient not taking: Reported on 01/23/2018 08/05/16   Linna Hoff, MD  penicillin v potassium (VEETID) 500 MG tablet Take 1 tablet (500 mg total) by mouth 4 (four) times daily for 7 days. 01/23/18 01/30/18  Anabel Lykins, Waylan Boga, PA-C  traMADol (ULTRAM) 50 MG tablet Take 1 tablet (50 mg total) by mouth every 6 (six) hours as needed. Patient not taking: Reported on 01/23/2018 08/03/17   Eyvonne Mechanic, PA-C    Family History No family history on file.  Social History Social History   Tobacco Use  . Smoking status: Never Smoker  . Smokeless tobacco: Never Used  Substance Use Topics  . Alcohol use: No  . Drug use: No     Allergies   Zofran [ondansetron]   Review of Systems Review of Systems  Constitutional: Negative for fever.  HENT: Positive for dental problem.   Musculoskeletal: Positive for neck pain (radiation).  Physical Exam Updated Vital Signs BP 116/75 (BP Location: Left Arm)   Pulse 79   Temp 98.2 F (36.8 C) (Oral)   Resp 15   LMP 01/11/2018   SpO2 100%   Physical Exam  Constitutional: She appears well-developed and well-nourished. No distress.  HENT:  Head: Normocephalic and atraumatic.  Right Ear: Tympanic membrane normal.  Left Ear: Tympanic membrane normal.  Mouth/Throat: Oropharynx is clear and moist. No oropharyngeal exudate.    No submandibular masses, but one tender, mobile, palpable lymph node  Eyes: Pupils are equal, round, and reactive to light. Conjunctivae are normal. Right eye exhibits no discharge. Left eye exhibits no discharge. No scleral icterus.  Neck: Normal range of motion. Neck supple. No thyromegaly present.  Cardiovascular: Normal rate, regular rhythm, normal heart sounds and intact distal pulses. Exam reveals no gallop and no friction rub.  No murmur heard. Pulmonary/Chest: Effort normal and  breath sounds normal. No stridor. No respiratory distress. She has no wheezes. She has no rales.  Musculoskeletal: She exhibits no edema.  Lymphadenopathy:    She has no cervical adenopathy.  Neurological: She is alert. Coordination normal.  Skin: Skin is warm and dry. No rash noted. She is not diaphoretic. No pallor.  Psychiatric: She has a normal mood and affect.  Nursing note and vitals reviewed.    ED Treatments / Results  Labs (all labs ordered are listed, but only abnormal results are displayed) Labs Reviewed - No data to display  EKG None  Radiology No results found.  Procedures Procedures (including critical care time)  Medications Ordered in ED Medications  penicillin v potassium (VEETID) tablet 500 mg (has no administration in time range)     Initial Impression / Assessment and Plan / ED Course  I have reviewed the triage vital signs and the nursing notes.  Pertinent labs & imaging results that were available during my care of the patient were reviewed by me and considered in my medical decision making (see chart for details).     Patient with dentalgia.  No abscess requiring immediate incision and drainage.  Exam not concerning for Ludwig's angina or pharyngeal abscess.  Will treat with penicillin, ibuprofen, Tylenol, benzocaine. Pt instructed to follow-up with dentist.  Patient given resources.  Discussed return precautions.  She understands and agrees with plan.  Patient vitals stable throughout ED course and discharged in satisfactory condition.   Final Clinical Impressions(s) / ED Diagnoses   Final diagnoses:  Pain, dental    ED Discharge Orders        Ordered    penicillin v potassium (VEETID) 500 MG tablet  4 times daily     01/23/18 1010    ibuprofen (ADVIL,MOTRIN) 800 MG tablet  3 times daily     01/23/18 1010    acetaminophen (TYLENOL) 500 MG tablet  Every 6 hours PRN     01/23/18 1010    benzocaine (ORAJEL) 10 % mucosal gel  As needed      01/23/18 1011       Meko Masterson, WindmillAlexandra M, PA-C 01/23/18 1030    Linwood DibblesKnapp, Jon, MD 01/23/18 1553

## 2018-01-23 NOTE — ED Notes (Signed)
Pt is alert and oriented x 4 and is verbally responsive. Pt reports Rt upper tooth pain 10/10  and has some rt side face swelling. Pt was given heat pack to help decrease pain. Pt resting in room.

## 2018-04-28 ENCOUNTER — Other Ambulatory Visit: Payer: Self-pay

## 2018-04-28 ENCOUNTER — Encounter (HOSPITAL_COMMUNITY): Payer: Self-pay | Admitting: Emergency Medicine

## 2018-04-28 ENCOUNTER — Emergency Department (HOSPITAL_COMMUNITY)
Admission: EM | Admit: 2018-04-28 | Discharge: 2018-04-28 | Disposition: A | Discharge status: Home / Self Care | Payer: Self-pay | Attending: Emergency Medicine | Admitting: Emergency Medicine

## 2018-04-28 DIAGNOSIS — K0889 Other specified disorders of teeth and supporting structures: Secondary | ICD-10-CM | POA: Insufficient documentation

## 2018-04-28 DIAGNOSIS — J45909 Unspecified asthma, uncomplicated: Secondary | ICD-10-CM | POA: Insufficient documentation

## 2018-04-28 MED ORDER — PENICILLIN V POTASSIUM 500 MG PO TABS: 500.0000 mg | ORAL_TABLET | Freq: Four times a day (QID) | ORAL | 0 refills | Status: DC

## 2018-04-28 MED ORDER — PENICILLIN V POTASSIUM 500 MG PO TABS
500.0000 mg | ORAL_TABLET | Freq: Once | ORAL | Status: AC
  Administered 2018-04-28: 500 mg via ORAL
  Filled 2018-04-28: qty 1

## 2018-04-28 MED ORDER — HYDROCODONE-ACETAMINOPHEN 5-325 MG PO TABS
1.0000 | ORAL_TABLET | Freq: Once | ORAL | Status: AC
  Administered 2018-04-28: 1 via ORAL
  Filled 2018-04-28: qty 1

## 2018-04-28 NOTE — ED Provider Notes (Signed)
[facility]-EMERGENCY DEPT Provider Note   CSN: [id number] Arrival date & time: 04/28/18  0547     History   Chief Complaint Chief Complaint  Patient presents with  . Dental Pain    HPI Traci Spears is a 22 y.o. female.  The history is provided by the patient.  Dental Pain   This is a recurrent problem. The current episode started 2 days ago. The problem occurs daily. The problem has been gradually worsening. The pain is moderate. She has tried acetaminophen for the symptoms. The treatment provided no relief.   Patient presents for dental pain.  She reports pain in the right upper molars the past 2 days.  No fevers or vomiting.  She reports facial swelling. She has an expander in place that she is had for several years, which was placed in [place].  She has been unable to have any local orthodontist remove the expander  Past Medical History:  Diagnosis Date  . Anemia   . Asthma   . Headache   . Miscarriage     Patient Active Problem List   Diagnosis Date Noted  . Active labor at term 03/17/2016    Past Surgical History:  Procedure Laterality Date  . DILATION AND CURETTAGE OF UTERUS    . miscarriage       OB History    Gravida  2   Para  1   Term  1   Preterm      AB  1   Living  1     SAB  1   TAB      Ectopic      Multiple  0   Live Births  1            Home Medications    Prior to Admission medications   Medication Sig Start Date End Date Taking? Authorizing Provider  acetaminophen (TYLENOL) 500 MG tablet Take 1 tablet (500 mg total) by mouth every 6 (six) hours as needed. 01/23/18  Yes Law, Waylan Boga, PA-C  penicillin v potassium (VEETID) 500 MG tablet Take 1 tablet (500 mg total) by mouth 4 (four) times daily. 04/28/18   Zadie Rhine, MD    Family History No family history on file.  Social History Social History   Tobacco Use  . Smoking status: Never Smoker  . Smokeless tobacco: Never Used    Substance Use Topics  . Alcohol use: No  . Drug use: No     Allergies   Zofran [ondansetron]   Review of Systems Review of Systems  Constitutional: Negative for fever.  HENT: Positive for dental problem.      Physical Exam Updated Vital Signs BP 135/87 (BP Location: Left Arm)   Pulse 87   Temp 98.3 F (36.8 C) (Oral)   LMP 04/26/2018   SpO2 100%   Physical Exam CONSTITUTIONAL: Well developed/well nourished, anxious HEAD AND FACE: Normocephalic/atraumatic EYES: EOMI/PERRL ENMT: Mucous membranes moist.  Poor dentition.  No trismus.  No focal abscess noted., tenderness to right upper molar, no focal abscess.  Expander in place on palate NECK: supple no meningeal signs CV: S1/S2 noted, no murmurs/rubs/gallops noted LUNGS: Lungs are clear to auscultation bilaterally, no apparent distress ABDOMEN: soft, nontender, no rebound or guarding NEURO: Pt is awake/alert, moves all extremitiesx4 EXTREMITIES:full ROM SKIN: warm, color normal   ED Treatments / Results  Labs (all labs ordered are listed, but only abnormal results are displayed) Labs Reviewed - No data to display  EKG None  Radiology No results found.  Procedures Procedures    Medications Ordered in ED Medications  penicillin v potassium (VEETID) tablet 500 mg (has no administration in time range)  HYDROcodone-acetaminophen (NORCO/VICODIN) 5-325 MG per tablet 1 tablet (has no administration in time range)     Initial Impression / Assessment and Plan / ED Course  I have reviewed the triage vital signs and the nursing notes.      Patient with multiple ED visits for dental pain.  She still has expander on palate for several years.  She is been able to have this removed as she had placed in [place].  Encouraged her to continue to follow-up to have this removed, will place her on penicillin.  Final Clinical Impressions(s) / ED Diagnoses   Final diagnoses:  Pain, dental    ED Discharge Orders          Ordered    penicillin v potassium (VEETID) 500 MG tablet  4 times daily     04/28/18 1610           Zadie Rhine, MD 04/28/18 0630

## 2018-04-28 NOTE — ED Triage Notes (Signed)
Pt from home with c/o upper right dental pain x 2 days. Pt denies fever or chills. Pt does not have a dentist

## 2019-10-07 ENCOUNTER — Other Ambulatory Visit: Payer: Self-pay

## 2019-10-07 ENCOUNTER — Encounter (HOSPITAL_COMMUNITY): Payer: Self-pay

## 2019-10-07 ENCOUNTER — Emergency Department (HOSPITAL_COMMUNITY)
Admission: EM | Admit: 2019-10-07 | Discharge: 2019-10-07 | Disposition: A | Discharge status: Home / Self Care | Payer: Self-pay | Attending: Emergency Medicine | Admitting: Emergency Medicine

## 2019-10-07 DIAGNOSIS — K0889 Other specified disorders of teeth and supporting structures: Secondary | ICD-10-CM | POA: Insufficient documentation

## 2019-10-07 DIAGNOSIS — K047 Periapical abscess without sinus: Secondary | ICD-10-CM

## 2019-10-07 MED ORDER — IBUPROFEN 800 MG PO TABS: 800.0000 mg | ORAL_TABLET | Freq: Three times a day (TID) | ORAL | 0 refills | Status: AC | PRN

## 2019-10-07 MED ORDER — PENICILLIN V POTASSIUM 500 MG PO TABS: 500.0000 mg | ORAL_TABLET | Freq: Four times a day (QID) | ORAL | 0 refills | Status: DC

## 2019-10-07 MED ORDER — KETOROLAC TROMETHAMINE 60 MG/2ML IM SOLN
60.0000 mg | Freq: Once | INTRAMUSCULAR | Status: AC
  Administered 2019-10-07: 22:00:00 60 mg via INTRAMUSCULAR
  Filled 2019-10-07: qty 2

## 2019-10-07 MED ORDER — IBUPROFEN 800 MG PO TABS: 800.0000 mg | ORAL_TABLET | Freq: Three times a day (TID) | ORAL | 0 refills | Status: DC | PRN

## 2019-10-07 MED ORDER — TRAMADOL HCL 50 MG PO TABS: 50.0000 mg | ORAL_TABLET | Freq: Four times a day (QID) | ORAL | 0 refills | Status: DC | PRN

## 2019-10-07 MED ORDER — TRAMADOL HCL 50 MG PO TABS: 50.0000 mg | ORAL_TABLET | Freq: Four times a day (QID) | ORAL | 0 refills | Status: AC | PRN

## 2019-10-07 NOTE — ED Provider Notes (Signed)
Patient presents to the emergency department with dental pain that started 3 to 4 days ago.  The patient states that pressure and palpation of the area makes the pain worse.  Patient states that she did not take any medications prior to arrival for her symptoms.  The patient states that she does not have a dentist.  The patient states she is had problems with this tooth for a while.  The patient states the tooth broke off quite a while ago and has had problems ever since.  Patient denies chest pain, shortness of breath, nausea, vomiting, weakness, dizziness, headache, blurred vision, difficulty swallowing, difficulty breathing or syncope.  Patient Active Problem List   Diagnosis Date Noted  . Active labor at term 03/17/2016    Past Surgical History:  Procedure Laterality Date  . DILATION AND CURETTAGE OF UTERUS    . miscarriage       OB History    Gravida  2   Para  1   Term  1   Preterm      AB  1   Living  1     SAB  1   TAB      Ectopic      Multiple  0   Live Births  1           History reviewed. No pertinent family history.  Social History   Tobacco Use  . Smoking status: Never Smoker  . Smokeless tobacco: Never Used  Substance Use Topics  . Alcohol use: No  . Drug use: Yes    Types: Marijuana    Home Medications Prior to Admission medications   Medication Sig Start Date End Date Taking? Authorizing Provider  acetaminophen (TYLENOL) 500 MG tablet Take 1 tablet (500 mg total) by mouth every 6 (six) hours as needed. 01/23/18   Law, Waylan Boga, PA-C  penicillin v potassium (VEETID) 500 MG tablet Take 1 tablet (500 mg total) by mouth 4 (four) times daily. 04/28/18   Zadie Rhine, MD    Allergies    Zofran [ondansetron]  Review of Systems   Review of Systems All other systems negative except as documented in the HPI. All pertinent positives and negatives as reviewed in the HPI. Physical Exam Updated Vital Signs BP 130/84   Pulse 87   Temp  99.3 F (37.4 C) (Oral)   Resp 16   Ht 5\' 1"  (1.549 m)   Wt 63.5 kg   BMI 26.43 kg/m   Physical Exam Vitals and nursing note reviewed.  Constitutional:      General: She is not in acute distress.    Appearance: She is well-developed.  HENT:     Head: Normocephalic and atraumatic.     Mouth/Throat:   Eyes:     Pupils: Pupils are equal, round, and reactive to light.  Pulmonary:     Effort: Pulmonary effort is normal.  Skin:    General: Skin is warm and dry.  Neurological:     Mental Status: She is alert and oriented to person, place, and time.     ED Results / Procedures / Treatments   Labs (all labs ordered are listed, but only abnormal results are displayed) Labs Reviewed - No data to display  EKG None  Radiology No results found.  Procedures Procedures (including critical care time)  Medications Ordered in ED Medications  ketorolac (TORADOL) injection 60 mg (has no administration in time range)    ED Course  I have reviewed  the triage vital signs and the nursing notes.  Pertinent labs & imaging results that were available during my care of the patient were reviewed by me and considered in my medical decision making (see chart for details).    MDM Rules/Calculators/A&P                     The patient is referred to dentistry for further evaluation and care. Told to return here as needed.  Does not have any airway compromise or throat swelling or mouth swelling that is significantly obstructing the airway or neck    final Clinical Impression(s) / ED Diagnoses Final diagnoses:  None    Rx / DC Orders ED Discharge Orders    None       Rebeca Allegra 10/09/19 2029    Milton Ferguson, MD 10/11/19 [phone]

## 2019-10-07 NOTE — ED Triage Notes (Signed)
Pt presents with c/o right lower jaw pain due to a cavity. Pt states the pain has radiated to her jaw and into her ear. Pt is A&Ox4.

## 2019-10-07 NOTE — Discharge Instructions (Signed)
Return here as needed.  Follow-up with the dentist provided.  Rinse with warm water and peroxide 3 times a day.  Use warm compresses over the area as well.

## 2020-01-21 ENCOUNTER — Other Ambulatory Visit: Payer: Self-pay

## 2020-01-21 ENCOUNTER — Encounter (HOSPITAL_COMMUNITY): Payer: Self-pay

## 2020-01-21 DIAGNOSIS — J45909 Unspecified asthma, uncomplicated: Secondary | ICD-10-CM | POA: Insufficient documentation

## 2020-01-21 DIAGNOSIS — K0889 Other specified disorders of teeth and supporting structures: Secondary | ICD-10-CM | POA: Insufficient documentation

## 2020-01-21 NOTE — ED Triage Notes (Addendum)
Right sided dental pain coming from the permanent retainer for a couple years per pt.

## 2020-01-22 ENCOUNTER — Emergency Department (HOSPITAL_COMMUNITY)
Admission: EM | Admit: 2020-01-22 | Discharge: 2020-01-22 | Disposition: A | Discharge status: Home / Self Care | Payer: Self-pay | Attending: Emergency Medicine | Admitting: Emergency Medicine

## 2020-01-22 ENCOUNTER — Encounter (HOSPITAL_COMMUNITY): Payer: Self-pay | Admitting: Student

## 2020-01-22 DIAGNOSIS — K0889 Other specified disorders of teeth and supporting structures: Secondary | ICD-10-CM

## 2020-01-22 MED ORDER — LIDOCAINE VISCOUS HCL 2 % MT SOLN: 15.0000 mL | OROMUCOSAL | 0 refills | Status: DC | PRN

## 2020-01-22 MED ORDER — AMOXICILLIN-POT CLAVULANATE 875-125 MG PO TABS: 1.0000 | ORAL_TABLET | Freq: Two times a day (BID) | ORAL | 0 refills | Status: DC

## 2020-01-22 MED ORDER — NAPROXEN 500 MG PO TABS: 500.0000 mg | ORAL_TABLET | Freq: Two times a day (BID) | ORAL | 0 refills | Status: AC | PRN

## 2020-01-22 MED ORDER — LIDOCAINE VISCOUS HCL 2 % MT SOLN
15.0000 mL | Freq: Once | OROMUCOSAL | Status: AC
  Administered 2020-01-22: 15 mL via OROMUCOSAL
  Filled 2020-01-22: qty 15

## 2020-01-22 NOTE — ED Provider Notes (Signed)
[facility]-EMERGENCY DEPT Provider Note   CSN: [id number] Arrival date & time: 01/21/20  2244     History Chief Complaint  Patient presents with  . Dental Pain    Traci Spears is a 24 y.o. female with a history of asthma and anemia who presents to the emergency department with complaints of chronic dental pain that acutely worsened over the past couple of days..  She states the problem is primarily to the right lower molar, also having pain to the right upper area secondary to a permanent retainer.  Pain is constant, severe, no alleviating or aggravating factors.  She does not currently see a dentist.  She denies fever, chills, intraoral drainage, change in voice, dysphagia, or neck stiffness.  HPI     Past Medical History:  Diagnosis Date  . Anemia   . Asthma   . Headache   . Miscarriage     Patient Active Problem List   Diagnosis Date Noted  . Active labor at term 03/17/2016    Past Surgical History:  Procedure Laterality Date  . DILATION AND CURETTAGE OF UTERUS    . miscarriage       OB History    Gravida  2   Para  1   Term  1   Preterm      AB  1   Living  1     SAB  1   TAB      Ectopic      Multiple  0   Live Births  1           No family history on file.  Social History   Tobacco Use  . Smoking status: Never Smoker  . Smokeless tobacco: Never Used  Substance Use Topics  . Alcohol use: No  . Drug use: Yes    Types: Marijuana    Home Medications Prior to Admission medications   Medication Sig Start Date End Date Taking? Authorizing Provider  acetaminophen (TYLENOL) 500 MG tablet Take 1 tablet (500 mg total) by mouth every 6 (six) hours as needed. 01/23/18   Law, Waylan Boga, PA-C  ibuprofen (ADVIL) 800 MG tablet Take 1 tablet (800 mg total) by mouth every 8 (eight) hours as needed. 10/07/19   Lawyer, Cristal Deer, PA-C  penicillin v potassium (VEETID) 500 MG tablet Take 1 tablet (500 mg total) by mouth 4  (four) times daily. 10/07/19   Lawyer, Cristal Deer, PA-C  traMADol (ULTRAM) 50 MG tablet Take 1 tablet (50 mg total) by mouth every 6 (six) hours as needed for severe pain. 10/07/19   Lawyer, Cristal Deer, PA-C    Allergies    Zofran [ondansetron]  Review of Systems   Review of Systems  Constitutional: Negative for chills and fever.  HENT: Positive for dental problem. Negative for congestion, ear pain, sore throat, trouble swallowing and voice change.   Respiratory: Negative for shortness of breath.   Cardiovascular: Negative for chest pain.  Gastrointestinal: Negative for abdominal pain, nausea and vomiting.  Neurological: Negative for syncope.    Physical Exam Updated Vital Signs BP 112/69   Pulse 79   Temp 98 F (36.7 C) (Oral)   Resp 18   Ht 5\' 1"  (1.549 m)   Wt 68 kg   SpO2 98%   BMI 28.34 kg/m   Physical Exam Vitals and nursing note reviewed.  Constitutional:      General: She is not in acute distress.    Appearance: She is well-developed. She is  not toxic-appearing.  HENT:     Head: Normocephalic and atraumatic.     Right Ear: Tympanic membrane is not perforated, erythematous, retracted or bulging.     Left Ear: Tympanic membrane is not perforated, erythematous, retracted or bulging.     Nose: Nose normal.     Mouth/Throat:     Pharynx: Uvula midline. No oropharyngeal exudate, posterior oropharyngeal erythema or uvula swelling.     Tonsils: No tonsillar abscesses.     Comments: Patient has multiple absent teeth.  She has a retainer in place to the hard palate.  Her right first lower molar has a cavity present, surrounding gingiva is mildly erythematous and swollen with tenderness to palpation.  Tenderness extends posteriorly.  There is no palpable fluctuance or gross abscess.  She also has some tenderness to the right side of her hard palate retainer.  No open wounds.  No palpable fluctuance or obvious abscess to this area either. Eyes:     General:        Right eye:  No discharge.        Left eye: No discharge.     Conjunctiva/sclera: Conjunctivae normal.  Cardiovascular:     Heart sounds: No murmur heard.   Pulmonary:     Effort: Pulmonary effort is normal.  Musculoskeletal:     Cervical back: Normal range of motion and neck supple. No edema, erythema, rigidity or crepitus. No pain with movement.  Lymphadenopathy:     Cervical: No cervical adenopathy.  Neurological:     Mental Status: She is alert.  Psychiatric:        Behavior: Behavior normal.        Thought Content: Thought content normal.     ED Results / Procedures / Treatments   Labs (all labs ordered are listed, but only abnormal results are displayed) Labs Reviewed - No data to display  EKG None  Radiology No results found.  Procedures Procedures (including critical care time)  Medications Ordered in ED Medications  lidocaine (XYLOCAINE) 2 % viscous mouth solution 15 mL (has no administration in time range)    ED Course  I have reviewed the triage vital signs and the nursing notes.  Pertinent labs & imaging results that were available during my care of the patient were reviewed by me and considered in my medical decision making (see chart for details).    MDM Rules/Calculators/A&P                          Patient presents with dental pain. Patient is nontoxic appearing, vitals without significant abnormality. No gross abscess.  Exam unconcerning for Ludwig's angina or deep space infection.  Will treat with viscous lidocaine, Augmentin and Naproxen.  Urged patient to follow-up with dentist, dental resources were provided.  Discussed treatment plan and need for follow up as well as return precautions. Provided opportunity for questions, patient confirmed understanding and is agreeable to plan.  Final Clinical Impression(s) / ED Diagnoses Final diagnoses:  Pain, dental    Rx / DC Orders ED Discharge Orders         Ordered    amoxicillin-clavulanate (AUGMENTIN)  875-125 MG tablet  Every 12 hours     Discontinue  Reprint     01/22/20 0306    naproxen (NAPROSYN) 500 MG tablet  2 times daily PRN     Discontinue  Reprint     01/22/20 0306    lidocaine (XYLOCAINE) 2 % solution  Every  3 hours PRN     Discontinue  Reprint     01/22/20 0306           Berkley Cronkright, Pleas Koch, PA-C 01/22/20 7035    Geoffery Lyons, MD 01/22/20 [phone]

## 2020-01-22 NOTE — Discharge Instructions (Signed)
Call one of the dentists offices provided to schedule an appointment for re-evaluation and further management within the next 48 hours.   I have prescribed you Augmentin which is an antibiotic to treat the infection and Naproxen which is an anti-inflammatory medicine to treat the pain.   Please take all of your antibiotics until finished. You may develop abdominal discomfort or diarrhea from the antibiotic.  You may help offset this with probiotics which you can buy at the store (ask your pharmacist if unable to find) or get probiotics in the form of eating yogurt. Do not eat or take the probiotics until 2 hours after your antibiotic. If you are unable to tolerate these side effects follow-up with your primary care provider or return to the emergency department.   If you begin to experience any blistering, rashes, swelling, or difficulty breathing seek medical care for evaluation of potentially more serious side effects.   Be sure to eat something when taking the Naproxen as it can cause stomach upset and at worst stomach bleeding. Do not take additional non steroidal anti-inflammatory medicines such as Ibuprofen, Aleve, Advil, Mobic, Diclofenac, or goodie powder while taking Naproxen. You may supplement with Tylenol.   We are also sending you with viscous lidocaine, please use every 3 hours as needed for mouth pain. We have prescribed you new medication(s) today. Discuss the medications prescribed today with your pharmacist as they can have adverse effects and interactions with your other medicines including over the counter and prescribed medications. Seek medical evaluation if you start to experience new or abnormal symptoms after taking one of these medicines, seek care immediately if you start to experience difficulty breathing, feeling of your throat closing, facial swelling, or rash as these could be indications of a more serious allergic reaction  If you start to experience and new or worsening  symptoms return to the emergency department. If you start to experience fever, chills, neck stiffness/pain, or inability to move your neck or open your mouth come back to the emergency department immediately.

## 2020-01-24 ENCOUNTER — Other Ambulatory Visit: Payer: Self-pay

## 2020-01-24 ENCOUNTER — Emergency Department (HOSPITAL_COMMUNITY): Admission: EM | Admit: 2020-01-24 | Discharge: 2020-01-24 | Discharge status: Against Medical Advice | Payer: Self-pay

## 2020-01-25 ENCOUNTER — Ambulatory Visit: Payer: Self-pay

## 2020-01-25 NOTE — Telephone Encounter (Signed)
Patient called and says she was in the ED 3 days ago for dental pain and she was given medication and told to follow up with a dentist. She says she called and the program that Dr. Rocky Morel offers is closed and not opening back up until next month. She says the medication given is not working. She says she took the lidocaine and it felt like it made her throat swell up and she couldn't breathe. She says she went back to the ED yesterday but left because of the 3 hour wait. I advised since she doesn't have a PCP, call the local HD for assistance and go to the UC or ED. She verbalized understanding.

## 2020-03-24 ENCOUNTER — Other Ambulatory Visit: Payer: Self-pay

## 2020-03-24 ENCOUNTER — Other Ambulatory Visit: Payer: Self-pay | Admitting: Sleep Medicine

## 2020-03-24 DIAGNOSIS — I471 Supraventricular tachycardia: Secondary | ICD-10-CM

## 2020-03-27 LAB — NOVEL CORONAVIRUS, NAA: SARS-CoV-2, NAA: NOT DETECTED

## 2022-11-04 ENCOUNTER — Ambulatory Visit (HOSPITAL_COMMUNITY)
Admission: EM | Admit: 2022-11-04 | Discharge: 2022-11-04 | Disposition: A | Discharge status: Home / Self Care | Payer: Self-pay | Attending: Family Medicine | Admitting: Family Medicine

## 2022-11-04 ENCOUNTER — Encounter (HOSPITAL_COMMUNITY): Payer: Self-pay

## 2022-11-04 DIAGNOSIS — H02844 Edema of left upper eyelid: Secondary | ICD-10-CM

## 2022-11-04 MED ORDER — PREDNISONE 20 MG PO TABS: 40.0000 mg | ORAL_TABLET | Freq: Every day | ORAL | 0 refills | Status: AC

## 2022-11-04 MED ORDER — TOBRAMYCIN 0.3 % OP SOLN: 1.0000 [drp] | Freq: Four times a day (QID) | OPHTHALMIC | 0 refills | Status: AC

## 2022-11-04 NOTE — ED Triage Notes (Signed)
Pt is here for left eye swelling x 1 day. Denies any trauma to her eye.

## 2022-11-04 NOTE — ED Notes (Signed)
Called x 1. Left a message for patieant to come to UC to be seen.

## 2022-11-06 NOTE — ED Provider Notes (Signed)
  [facility]   [id number] 11/04/22 Arrival Time: 1225  ASSESSMENT & PLAN:  1. Swelling of left upper eyelid    Begin: Meds ordered this encounter  Medications   tobramycin (TOBREX) 0.3 % ophthalmic solution    Sig: Place 1 drop into the right eye every 6 (six) hours.    Dispense:  5 mL    Refill:  0   predniSONE (DELTASONE) 20 MG tablet    Sig: Take 2 tablets (40 mg total) by mouth daily.    Dispense:  10 tablet    Refill:  0    Warm compress to eye(s). Local eye care discussed.  Reviewed expectations re: course of current medical issues. Questions answered. Outlined signs and symptoms indicating need for more acute intervention. Patient verbalized understanding. After Visit Summary given.   SUBJECTIVE:  Traci Spears is a 27 y.o. female who presents with complaint of LEFT upper eyelid swelling and mild soreness; x couple of days; slightly worse. Denies specific eye pain. Normal vision. H/O stye in past with similar symptoms. Does itch a little. Denies eye trauma. Contact lens use: no. Recent illness: no. Self treatment: none.    OBJECTIVE:  Vitals:   11/04/22 1338  BP: 117/67  Pulse: 85  Resp: 18  Temp: 98.2 F (36.8 C)  TempSrc: Oral  SpO2: 98%    General appearance: alert; no distress HEENT: [place]; AT; PERRLA; no restriction of the extraocular movements OS: without reported pain; without conjunctival injection; without drainage; without corneal opacities; without limbal flush; with mild swelling up upper eyelid with a likely developing medial stye OD: normal Neck: supple without LAD Lungs: unlabored respirations Skin: warm and dry Psychological: alert and cooperative; normal mood and affect   Allergies  Allergen Reactions   Zofran [Ondansetron] Anaphylaxis    Made under the tongue hurt/painful    Past Medical History:  Diagnosis Date   Anemia    Asthma    Headache    Miscarriage    Social History   Socioeconomic History   Marital  status: Single    Spouse name: Not on file   Number of children: Not on file   Years of education: Not on file   Highest education level: Not on file  Occupational History   Not on file  Tobacco Use   Smoking status: Never   Smokeless tobacco: Never  Substance and Sexual Activity   Alcohol use: No   Drug use: Yes    Types: Marijuana   Sexual activity: Yes  Other Topics Concern   Not on file  Social History Narrative   Not on file   Social Determinants of Health   Financial Resource Strain: Not on file  Food Insecurity: Not on file  Transportation Needs: Not on file  Physical Activity: Not on file  Stress: Not on file  Social Connections: Not on file  Intimate Partner Violence: Not on file   History reviewed. No pertinent family history. Past Surgical History:  Procedure Laterality Date   DILATION AND CURETTAGE OF UTERUS     miscarriage        Mardella Layman, MD 11/06/22 [phone]

## 2023-08-27 DIAGNOSIS — Z419 Encounter for procedure for purposes other than remedying health state, unspecified: Secondary | ICD-10-CM | POA: Diagnosis not present

## 2023-09-13 DIAGNOSIS — Z419 Encounter for procedure for purposes other than remedying health state, unspecified: Secondary | ICD-10-CM | POA: Diagnosis not present

## 2023-10-25 DIAGNOSIS — Z419 Encounter for procedure for purposes other than remedying health state, unspecified: Secondary | ICD-10-CM | POA: Diagnosis not present

## 2023-11-24 DIAGNOSIS — Z419 Encounter for procedure for purposes other than remedying health state, unspecified: Secondary | ICD-10-CM | POA: Diagnosis not present

## 2023-12-25 DIAGNOSIS — Z419 Encounter for procedure for purposes other than remedying health state, unspecified: Secondary | ICD-10-CM | POA: Diagnosis not present

## 2024-01-24 DIAGNOSIS — Z419 Encounter for procedure for purposes other than remedying health state, unspecified: Secondary | ICD-10-CM | POA: Diagnosis not present

## 2024-01-30 ENCOUNTER — Encounter: Payer: Self-pay | Admitting: Advanced Practice Midwife

## 2024-02-24 DIAGNOSIS — Z419 Encounter for procedure for purposes other than remedying health state, unspecified: Secondary | ICD-10-CM | POA: Diagnosis not present

## 2024-03-26 DIAGNOSIS — Z419 Encounter for procedure for purposes other than remedying health state, unspecified: Secondary | ICD-10-CM | POA: Diagnosis not present

## 2024-06-19 ENCOUNTER — Encounter (HOSPITAL_COMMUNITY): Payer: Self-pay | Admitting: Emergency Medicine

## 2024-06-19 ENCOUNTER — Emergency Department (HOSPITAL_COMMUNITY)

## 2024-06-19 ENCOUNTER — Emergency Department (HOSPITAL_COMMUNITY)
Admission: EM | Admit: 2024-06-19 | Discharge: 2024-06-19 | Disposition: A | Discharge status: Home / Self Care | Payer: Worker's Compensation | Attending: Emergency Medicine | Admitting: Emergency Medicine

## 2024-06-19 ENCOUNTER — Other Ambulatory Visit: Payer: Self-pay

## 2024-06-19 DIAGNOSIS — Y990 Civilian activity done for income or pay: Secondary | ICD-10-CM | POA: Insufficient documentation

## 2024-06-19 DIAGNOSIS — J45909 Unspecified asthma, uncomplicated: Secondary | ICD-10-CM | POA: Insufficient documentation

## 2024-06-19 DIAGNOSIS — Z23 Encounter for immunization: Secondary | ICD-10-CM | POA: Insufficient documentation

## 2024-06-19 DIAGNOSIS — S51012A Laceration without foreign body of left elbow, initial encounter: Secondary | ICD-10-CM | POA: Insufficient documentation

## 2024-06-19 DIAGNOSIS — W25XXXA Contact with sharp glass, initial encounter: Secondary | ICD-10-CM | POA: Insufficient documentation

## 2024-06-19 MED ORDER — LIDOCAINE-EPINEPHRINE (PF) 2 %-1:200000 IJ SOLN
20.0000 mL | Freq: Once | INTRAMUSCULAR | Status: AC
  Administered 2024-06-19: 20 mL via INTRADERMAL
  Filled 2024-06-19: qty 20

## 2024-06-19 MED ORDER — TETANUS-DIPHTH-ACELL PERTUSSIS 5-2-15.5 LF-MCG/0.5 IM SUSP
0.5000 mL | Freq: Once | INTRAMUSCULAR | Status: AC
  Administered 2024-06-19: 0.5 mL via INTRAMUSCULAR
  Filled 2024-06-19: qty 0.5

## 2024-06-19 MED ORDER — ACETAMINOPHEN 500 MG PO TABS
1000.0000 mg | ORAL_TABLET | Freq: Once | ORAL | Status: AC
  Administered 2024-06-19: 1000 mg via ORAL
  Filled 2024-06-19: qty 2

## 2024-06-19 NOTE — ED Triage Notes (Signed)
 Patient c/o left elbow laceration after a random broken bottle scrape her arm at work. Bleeding controlled. Pressure dressing was applied at her work place.

## 2024-06-19 NOTE — ED Provider Notes (Signed)
 [place] EMERGENCY DEPARTMENT AT [facility] Provider Note  CSN: [id number] Arrival date & time: 06/19/24 0249  Chief Complaint(s) Extremity Laceration  HPI Traci Spears is a 28 y.o. female here for a laceration to the left elbow.  Patient reports that she cut it on a broken glass from work.  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  While walking past a band of trash bags, she scraped her arm against a broken bottle.  No fall or trauma.  No other injuries.  Patient not up-to-date on tetanus.  The history is provided by the patient.    Clinical Course as of 06/19/24 0502  Sat Jun 19, 2024  0501 X-ray obtained to assess for foreign body which was negative.  Laceration was thoroughly irrigated and closed as below.  Return for suture removal.  [PC]    Clinical Course User Index [PC] Aaryn Parrilla, Raynell Moder, MD    Medical Decision Making Amount and/or Complexity of Data Reviewed Radiology: ordered and independent interpretation performed. Decision-making details documented in ED Course.  Risk OTC drugs. Prescription drug management.     Final Clinical Impression(s) / ED Diagnoses Final diagnoses:  Laceration of left elbow, initial encounter    The patient appears reasonably screened and/or stabilized for discharge and I doubt any other medical condition or other [facility] requiring further screening, evaluation, or treatment in the ED at this time. I have discussed the findings, Dx and Tx plan with the patient/family who expressed understanding and agree(s) with the plan. Discharge instructions discussed at length. The patient/family was given strict return precautions who verbalized understanding of the instructions. No further questions at time of discharge.  Disposition: Discharge  Condition: Good  ED Discharge Orders     None        Follow Up: [facility] Emergency Department at [facility] [address] [place]   [address] [phone]  for suture removal in 10-14 days    Past Medical History Past Medical History:  Diagnosis Date   Anemia    Asthma    Headache    Miscarriage    Patient Active Problem List   Diagnosis Date Noted   Active labor at term 03/17/2016   Home Medication(s) Prior to Admission medications   Medication Sig Start Date End Date Taking? Authorizing Provider  acetaminophen  (TYLENOL ) 500 MG tablet Take 1 tablet (500 mg total) by mouth every 6 (six) hours as needed. 01/23/18   Law, Alexandra M, PA-C  ibuprofen  (ADVIL ) 800 MG tablet Take 1 tablet (800 mg total) by mouth every 8 (eight) hours as needed. 10/07/19   Lawyer, Lonni, PA-C  naproxen  (NAPROSYN ) 500 MG tablet Take 1 tablet (500 mg total) by mouth 2 (two) times daily as needed for moderate pain. 01/22/20   Petrucelli, Samantha R, PA-C  predniSONE  (DELTASONE ) 20 MG tablet Take 2 tablets (40 mg total) by mouth daily. 11/04/22   Rolinda Rogue, MD  tobramycin  (TOBREX ) 0.3 % ophthalmic solution Place 1 drop into the right eye every 6 (six) hours. 11/04/22   Rolinda Rogue, MD  traMADol  (ULTRAM ) 50 MG tablet Take 1 tablet (50 mg total) by mouth every 6 (six) hours as needed for severe pain. 10/07/19   Tracey Lonni, PA-C  Allergies Zofran  [ondansetron ]  Review of Systems Review of Systems As noted in HPI  Physical Exam Vital Signs  I have reviewed the triage vital signs BP 126/73   Pulse 89   Temp 98.1 F (36.7 C) (Oral)   Resp 14   SpO2 100%   Physical Exam Vitals reviewed.  Constitutional:      General: She is not in acute distress.    Appearance: She is well-developed. She is not diaphoretic.  HENT:     Head: Normocephalic and atraumatic.     Right Ear: External ear normal.     Left Ear: External ear normal.     Nose: Nose normal.  Eyes:     General: No scleral  icterus.    Conjunctiva/sclera: Conjunctivae normal.  Neck:     Trachea: Phonation normal.  Cardiovascular:     Rate and Rhythm: Normal rate and regular rhythm.  Pulmonary:     Effort: Pulmonary effort is normal. No respiratory distress.     Breath sounds: No stridor.  Abdominal:     General: There is no distension.  Musculoskeletal:        General: Normal range of motion.     Left elbow: Laceration (approx 2.8 cm long x 5 mm deep) present. No swelling or deformity. Normal range of motion. No tenderness.     Cervical back: Normal range of motion.  Neurological:     Mental Status: She is alert and oriented to person, place, and time.  Psychiatric:        Behavior: Behavior normal.     ED Results and Treatments Labs (all labs ordered are listed, but only abnormal results are displayed) Labs Reviewed - No data to display                                                                                                                       EKG  EKG Interpretation Date/Time:    Ventricular Rate:    PR Interval:    QRS Duration:    QT Interval:    QTC Calculation:   R Axis:      Text Interpretation:         Radiology DG Elbow 2 Views Left Result Date: 06/19/2024 EXAM: 1 OR 2 VIEW(S) XRAY OF THE LEFT ELBOW COMPARISON: None available. CLINICAL HISTORY: laceration FINDINGS: BONES AND JOINTS: No acute fracture. No malalignment. SOFT TISSUES: Contraceptive implant in superficial soft tissues of medial left arm. Otherwise, no unexpected retained radiopaque foreign body. IMPRESSION: 1. No acute fracture or dislocation. Electronically signed by: Morgane Naveau MD 06/19/2024 03:48 AM EST RP Workstation: [id number]    Medications Ordered in ED Medications  lidocaine -EPINEPHrine  (XYLOCAINE  W/EPI) 2 %-1:200000 (PF) injection 20 mL (has no administration in time range)  Tdap (ADACEL ) injection 0.5 mL (has no administration in time range)  acetaminophen  (TYLENOL ) tablet 1,000 mg (has no  administration in time range)   Procedures .Laceration Repair  Date/Time: 06/19/2024 5:03 AM  Performed by: Trine Raynell Moder,  MD Authorized by: Trine Raynell Moder, MD   Consent:    Consent obtained:  Verbal   Consent given by:  Patient   Risks discussed:  Infection, pain and poor wound healing Universal protocol:    Imaging studies available: yes     Patient identity confirmed:  Verbally with patient Anesthesia:    Anesthesia method:  Local infiltration   Local anesthetic:  Lidocaine  2% WITH epi Laceration details:    Location:  Shoulder/arm   Shoulder/arm location:  L elbow   Length (cm):  2.8   Depth (mm):  5 Pre-procedure details:    Preparation:  Patient was prepped and draped in usual sterile fashion and imaging obtained to evaluate for foreign bodies Exploration:    Hemostasis achieved with:  Direct pressure   Imaging obtained: x-ray     Imaging outcome: foreign body not noted     Wound extent: fascia not violated, no foreign body, no signs of injury, no tendon damage, no underlying fracture and no vascular damage     Contaminated: no   Treatment:    Area cleansed with:  Povidone-iodine   Amount of cleaning:  Extensive   Irrigation solution:  Sterile saline   Irrigation volume:  1000cc   Irrigation method:  Pressure wash   Debridement:  None Skin repair:    Repair method:  Sutures   Suture size:  4-0   Suture material:  Nylon   Suture technique:  Horizontal mattress and simple interrupted   Number of sutures:  3 Approximation:    Approximation:  Close Repair type:    Repair type:  Simple Post-procedure details:    Dressing:  Non-adherent dressing   Procedure completion:  Tolerated   (including critical care time)   This chart was dictated using voice recognition software.  Despite best efforts to proofread,  errors can occur which can change the documentation meaning.   Trine Raynell Moder, MD 06/19/24 [phone]

## 2024-06-19 NOTE — Discharge Instructions (Signed)
 For pain control you may take 1000 mg of acetaminophen  (Tylenol ) every 8 hours and/or 600 mg of Ibuprofen  (Motrin , Advil , etc.) every 6-8 hours as needed.  Please limit acetaminophen  (Tylenol ) to 4000 mg and Ibuprofen  (Motrin , Advil , etc.) to 2400 mg for a 24hr period. Please note that other over-the-counter medicine may contain acetaminophen  or ibuprofen  as a component of their ingredients.   Do not let your laceration (cut) get wet for the next 48 hours. After that you may allow soapy water to drain down the wound to clean it.  Please do not scrub.  Do not submerge the wound under water for the next 2 weeks.   To minimize scarring, you can apply a vaseline based ointment for the next 2 weeks and keep it out of direct sun light. After that, you may apply sunscreen for the next several months.   Your stitches will need to be removed in 10-14 days.   Return if your wound appears to be infected (see laceration care instructions).

## 2024-06-25 DIAGNOSIS — Z419 Encounter for procedure for purposes other than remedying health state, unspecified: Secondary | ICD-10-CM | POA: Diagnosis not present
